# Patient Record
Sex: Female | Born: 1980 | Race: White | Hispanic: No | Marital: Married | Smoking: Current every day smoker
Health system: Southern US, Community
[De-identification: ages and names within clinical notes are randomized; demographics above are authoritative.]

## PROBLEM LIST (undated history)

## (undated) ENCOUNTER — Inpatient Hospital Stay: Payer: Self-pay

## (undated) DIAGNOSIS — H4710 Unspecified papilledema: Secondary | ICD-10-CM

## (undated) DIAGNOSIS — D649 Anemia, unspecified: Secondary | ICD-10-CM

## (undated) DIAGNOSIS — Z5189 Encounter for other specified aftercare: Secondary | ICD-10-CM

## (undated) DIAGNOSIS — R87629 Unspecified abnormal cytological findings in specimens from vagina: Secondary | ICD-10-CM

## (undated) DIAGNOSIS — T7840XA Allergy, unspecified, initial encounter: Secondary | ICD-10-CM

## (undated) DIAGNOSIS — I499 Cardiac arrhythmia, unspecified: Secondary | ICD-10-CM

## (undated) DIAGNOSIS — F419 Anxiety disorder, unspecified: Secondary | ICD-10-CM

## (undated) DIAGNOSIS — J45909 Unspecified asthma, uncomplicated: Secondary | ICD-10-CM

## (undated) DIAGNOSIS — M419 Scoliosis, unspecified: Secondary | ICD-10-CM

## (undated) DIAGNOSIS — K219 Gastro-esophageal reflux disease without esophagitis: Secondary | ICD-10-CM

## (undated) DIAGNOSIS — E876 Hypokalemia: Secondary | ICD-10-CM

## (undated) HISTORY — DX: Encounter for other specified aftercare: Z51.89

## (undated) HISTORY — DX: Unspecified asthma, uncomplicated: J45.909

## (undated) HISTORY — DX: Allergy, unspecified, initial encounter: T78.40XA

---
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
## 2013-09-02 DIAGNOSIS — O3410 Maternal care for benign tumor of corpus uteri, unspecified trimester: Secondary | ICD-10-CM | POA: Insufficient documentation

## 2013-09-23 DIAGNOSIS — O4700 False labor before 37 completed weeks of gestation, unspecified trimester: Secondary | ICD-10-CM | POA: Insufficient documentation

## 2013-10-20 DIAGNOSIS — B3731 Acute candidiasis of vulva and vagina: Secondary | ICD-10-CM | POA: Insufficient documentation

## 2013-10-20 DIAGNOSIS — Z369 Encounter for antenatal screening, unspecified: Secondary | ICD-10-CM | POA: Insufficient documentation

## 2015-08-03 ENCOUNTER — Encounter: Payer: Self-pay | Admitting: Emergency Medicine

## 2015-08-03 ENCOUNTER — Emergency Department
Admission: EM | Admit: 2015-08-03 | Discharge: 2015-08-03 | Disposition: A | Discharge status: Home / Self Care | Payer: Medicaid Other | Attending: Emergency Medicine | Admitting: Emergency Medicine

## 2015-08-03 DIAGNOSIS — Z3A01 Less than 8 weeks gestation of pregnancy: Secondary | ICD-10-CM | POA: Insufficient documentation

## 2015-08-03 DIAGNOSIS — O2311 Infections of bladder in pregnancy, first trimester: Secondary | ICD-10-CM | POA: Diagnosis not present

## 2015-08-03 DIAGNOSIS — Z3491 Encounter for supervision of normal pregnancy, unspecified, first trimester: Secondary | ICD-10-CM

## 2015-08-03 DIAGNOSIS — O9989 Other specified diseases and conditions complicating pregnancy, childbirth and the puerperium: Secondary | ICD-10-CM | POA: Diagnosis present

## 2015-08-03 LAB — COMPREHENSIVE METABOLIC PANEL
ALBUMIN: 4.2 g/dL (ref 3.5–5.0)
ALT: 16 U/L (ref 14–54)
ANION GAP: 6 (ref 5–15)
AST: 18 U/L (ref 15–41)
Alkaline Phosphatase: 49 U/L (ref 38–126)
BILIRUBIN TOTAL: 0.6 mg/dL (ref 0.3–1.2)
BUN: 8 mg/dL (ref 6–20)
CO2: 23 mmol/L (ref 22–32)
Calcium: 9.2 mg/dL (ref 8.9–10.3)
Chloride: 108 mmol/L (ref 101–111)
Creatinine, Ser: 0.69 mg/dL (ref 0.44–1.00)
GFR calc Af Amer: >60 mL/min (ref >60)
GFR calc non Af Amer: >60 mL/min (ref >60)
GLUCOSE: 92 mg/dL (ref 65–99)
POTASSIUM: 3.8 mmol/L (ref 3.5–5.1)
SODIUM: 137 mmol/L (ref 135–145)
Total Protein: 7.1 g/dL (ref 6.5–8.1)

## 2015-08-03 LAB — URINALYSIS COMPLETE WITH MICROSCOPIC (ARMC ONLY)
Bilirubin Urine: NEGATIVE
Glucose, UA: NEGATIVE mg/dL
Hgb urine dipstick: NEGATIVE
KETONES UR: NEGATIVE mg/dL
NITRITE: NEGATIVE
PH: 6 (ref 5.0–8.0)
PROTEIN: NEGATIVE mg/dL
Specific Gravity, Urine: 1.017 (ref 1.005–1.030)

## 2015-08-03 LAB — CBC WITH DIFFERENTIAL/PLATELET
Basophils Absolute: 0.1 10*3/uL (ref 0–0.1)
Basophils Relative: 1 %
Eosinophils Absolute: 0.1 10*3/uL (ref 0–0.7)
Eosinophils Relative: 1 %
HEMATOCRIT: 41.1 % (ref 35.0–47.0)
HEMOGLOBIN: 14 g/dL (ref 12.0–16.0)
LYMPHS ABS: 3.8 10*3/uL — AB (ref 1.0–3.6)
LYMPHS PCT: 33 %
MCH: 30.1 pg (ref 26.0–34.0)
MCHC: 34.1 g/dL (ref 32.0–36.0)
MCV: 88.3 fL (ref 80.0–100.0)
MONO ABS: 0.5 10*3/uL (ref 0.2–0.9)
MONOS PCT: 4 %
NEUTROS ABS: 7 10*3/uL — AB (ref 1.4–6.5)
NEUTROS PCT: 61 %
Platelets: 224 10*3/uL (ref 150–440)
RBC: 4.65 MIL/uL (ref 3.80–5.20)
RDW: 13.1 % (ref 11.5–14.5)
WBC: 11.5 10*3/uL — ABNORMAL HIGH (ref 3.6–11.0)

## 2015-08-03 LAB — PREGNANCY, URINE: PREG TEST UR: POSITIVE — AB

## 2015-08-03 LAB — LIPASE, BLOOD: Lipase: 61 U/L — ABNORMAL HIGH (ref 22–51)

## 2015-08-03 MED ORDER — NITROFURANTOIN MACROCRYSTAL 100 MG PO CAPS: 100.0000 mg | ORAL_CAPSULE | Freq: Two times a day (BID) | ORAL | Status: DC

## 2015-08-03 MED ORDER — ONDANSETRON HCL 4 MG/2ML IJ SOLN
4.0000 mg | Freq: Once | INTRAMUSCULAR | Status: AC
  Administered 2015-08-03: 4 mg via INTRAVENOUS
  Filled 2015-08-03: qty 2

## 2015-08-03 MED ORDER — KETOROLAC TROMETHAMINE 30 MG/ML IJ SOLN
30.0000 mg | Freq: Once | INTRAMUSCULAR | Status: AC
  Administered 2015-08-03: 30 mg via INTRAVENOUS
  Filled 2015-08-03: qty 1

## 2015-08-03 MED ORDER — PRENATAL VITAMINS 0.8 MG PO TABS: 1.0000 | ORAL_TABLET | Freq: Every day | ORAL | Status: DC

## 2015-08-03 NOTE — Discharge Instructions (Signed)
First Trimester of Pregnancy The first trimester of pregnancy is from week 1 until the end of week 12 (months 1 through 3). A week after a sperm fertilizes an egg, the egg will implant on the wall of the uterus. This embryo will begin to develop into a baby. Genes from you and your partner are forming the baby. The female genes determine whether the baby is a boy or a girl. At 6-8 weeks, the eyes and face are formed, and the heartbeat can be seen on ultrasound. At the end of 12 weeks, all the baby's organs are formed.  Now that you are pregnant, you will want to do everything you can to have a healthy baby. Two of the most important things are to get good prenatal care and to follow your health care provider's instructions. Prenatal care is all the medical care you receive before the baby's birth. This care will help prevent, find, and treat any problems during the pregnancy and childbirth. BODY CHANGES Your body goes through many changes during pregnancy. The changes vary from woman to woman.   You may gain or lose a couple of pounds at first.  You may feel sick to your stomach (nauseous) and throw up (vomit). If the vomiting is uncontrollable, call your health care provider.  You may tire easily.  You may develop headaches that can be relieved by medicines approved by your health care provider.  You may urinate more often. Painful urination may mean you have a bladder infection.  You may develop heartburn as a result of your pregnancy.  You may develop constipation because certain hormones are causing the muscles that push waste through your intestines to slow down.  You may develop hemorrhoids or swollen, bulging veins (varicose veins).  Your breasts may begin to grow larger and become tender. Your nipples may stick out more, and the tissue that surrounds them (areola) may become darker.  Your gums may bleed and may be sensitive to brushing and flossing.  Dark spots or blotches (chloasma,  mask of pregnancy) may develop on your face. This will likely fade after the baby is born.  Your menstrual periods will stop.  You may have a loss of appetite.  You may develop cravings for certain kinds of food.  You may have changes in your emotions from day to day, such as being excited to be pregnant or being concerned that something may go wrong with the pregnancy and baby.  You may have more vivid and strange dreams.  You may have changes in your hair. These can include thickening of your hair, rapid growth, and changes in texture. Some women also have hair loss during or after pregnancy, or hair that feels dry or thin. Your hair will most likely return to normal after your baby is born. WHAT TO EXPECT AT YOUR PRENATAL VISITS During a routine prenatal visit:  You will be weighed to make sure you and the baby are growing normally.  Your blood pressure will be taken.  Your abdomen will be measured to track your baby's growth.  The fetal heartbeat will be listened to starting around week 10 or 12 of your pregnancy.  Test results from any previous visits will be discussed. Your health care provider may ask you:  How you are feeling.  If you are feeling the baby move.  If you have had any abnormal symptoms, such as leaking fluid, bleeding, severe headaches, or abdominal cramping.  If you have any questions. Other tests  that may be performed during your first trimester include:  Blood tests to find your blood type and to check for the presence of any previous infections. They will also be used to check for low iron levels (anemia) and Rh antibodies. Later in the pregnancy, blood tests for diabetes will be done along with other tests if problems develop.  Urine tests to check for infections, diabetes, or protein in the urine.  An ultrasound to confirm the proper growth and development of the baby.  An amniocentesis to check for possible genetic problems.  Fetal screens for  spina bifida and Down syndrome.  You may need other tests to make sure you and the baby are doing well. HOME CARE INSTRUCTIONS  Medicines  Follow your health care provider's instructions regarding medicine use. Specific medicines may be either safe or unsafe to take during pregnancy.  Take your prenatal vitamins as directed.  If you develop constipation, try taking a stool softener if your health care provider approves. Diet  Eat regular, well-balanced meals. Choose a variety of foods, such as meat or vegetable-based protein, fish, milk and low-fat dairy products, vegetables, fruits, and whole grain breads and cereals. Your health care provider will help you determine the amount of weight gain that is right for you.  Avoid raw meat and uncooked cheese. These carry germs that can cause birth defects in the baby.  Eating four or five small meals rather than three large meals a day may help relieve nausea and vomiting. If you start to feel nauseous, eating a few soda crackers can be helpful. Drinking liquids between meals instead of during meals also seems to help nausea and vomiting.  If you develop constipation, eat more high-fiber foods, such as fresh vegetables or fruit and whole grains. Drink enough fluids to keep your urine clear or pale yellow. Activity and Exercise  Exercise only as directed by your health care provider. Exercising will help you:  Control your weight.  Stay in shape.  Be prepared for labor and delivery.  Experiencing pain or cramping in the lower abdomen or low back is a good sign that you should stop exercising. Check with your health care provider before continuing normal exercises.  Try to avoid standing for long periods of time. Move your legs often if you must stand in one place for a long time.  Avoid heavy lifting.  Wear low-heeled shoes, and practice good posture.  You may continue to have sex unless your health care provider directs you  otherwise. Relief of Pain or Discomfort  Wear a good support bra for breast tenderness.   Take warm sitz baths to soothe any pain or discomfort caused by hemorrhoids. Use hemorrhoid cream if your health care provider approves.   Rest with your legs elevated if you have leg cramps or low back pain.  If you develop varicose veins in your legs, wear support hose. Elevate your feet for 15 minutes, 3-4 times a day. Limit salt in your diet. Prenatal Care  Schedule your prenatal visits by the twelfth week of pregnancy. They are usually scheduled monthly at first, then more often in the last 2 months before delivery.  Write down your questions. Take them to your prenatal visits.  Keep all your prenatal visits as directed by your health care provider. Safety  Wear your seat belt at all times when driving.  Make a list of emergency phone numbers, including numbers for family, friends, the hospital, and police and fire departments. General Tips  Ask your health care provider for a referral to a local prenatal education class. Begin classes no later than at the beginning of month 6 of your pregnancy.  Ask for help if you have counseling or nutritional needs during pregnancy. Your health care provider can offer advice or refer you to specialists for help with various needs.  Do not use hot tubs, steam rooms, or saunas.  Do not douche or use tampons or scented sanitary pads.  Do not cross your legs for long periods of time.  Avoid cat litter boxes and soil used by cats. These carry germs that can cause birth defects in the baby and possibly loss of the fetus by miscarriage or stillbirth.  Avoid all smoking, herbs, alcohol, and medicines not prescribed by your health care provider. Chemicals in these affect the formation and growth of the baby.  Schedule a dentist appointment. At home, brush your teeth with a soft toothbrush and be gentle when you floss. SEEK MEDICAL CARE IF:   You have  dizziness.  You have mild pelvic cramps, pelvic pressure, or nagging pain in the abdominal area.  You have persistent nausea, vomiting, or diarrhea.  You have a bad smelling vaginal discharge.  You have pain with urination.  You notice increased swelling in your face, hands, legs, or ankles. SEEK IMMEDIATE MEDICAL CARE IF:   You have a fever.  You are leaking fluid from your vagina.  You have spotting or bleeding from your vagina.  You have severe abdominal cramping or pain.  You have rapid weight gain or loss.  You vomit blood or material that looks like coffee grounds.  You are exposed to [place] measles and have never had them.  You are exposed to fifth disease or chickenpox.  You develop a severe headache.  You have shortness of breath.  You have any kind of trauma, such as from a fall or a car accident. Document Released: 11/14/2001 Document Revised: 04/06/2014 Document Reviewed: 09/30/2013 [facility] Patient Information 2015 [place], [place]. This information is not intended to replace advice given to you by your health care provider. Make sure you discuss any questions you have with your health care provider.  Pregnancy and Urinary Tract Infection A urinary tract infection (UTI) is a bacterial infection of the urinary tract. Infection of the urinary tract can include the ureters, kidneys (pyelonephritis), bladder (cystitis), and urethra (urethritis). All pregnant women should be screened for bacteria in the urinary tract. Identifying and treating a UTI will decrease the risk of preterm labor and developing more serious infections in both the mother and baby. CAUSES Bacteria germs cause almost all UTIs.  RISK FACTORS Many factors can increase your chances of getting a UTI during pregnancy. These include:  Having a short urethra.  Poor toilet and hygiene habits.  Sexual intercourse.  Blockage of urine along the urinary tract.  Problems with the pelvic muscles or  nerves.  Diabetes.  Obesity.  Bladder problems after having several children.  Previous history of UTI. SIGNS AND SYMPTOMS   Pain, burning, or a stinging feeling when urinating.  Suddenly feeling the need to urinate right away (urgency).  Loss of bladder control (urinary incontinence).  Frequent urination, more than is common with pregnancy.  Lower abdominal or back discomfort.  Cloudy urine.  Blood in the urine (hematuria).  Fever. When the kidneys are infected, the symptoms may be:  Back pain.  Flank pain on the right side more so than the left.  Fever.  Chills.  Nausea.  Vomiting. DIAGNOSIS  A urinary tract infection is usually diagnosed through urine tests. Additional tests and procedures are sometimes done. These may include:  Ultrasound exam of the kidneys, ureters, bladder, and urethra.  Looking in the bladder with a lighted tube (cystoscopy). TREATMENT Typically, UTIs can be treated with antibiotic medicines.  HOME CARE INSTRUCTIONS   Only take over-the-counter or prescription medicines as directed by your health care provider. If you were prescribed antibiotics, take them as directed. Finish them even if you start to feel better.  Drink enough fluids to keep your urine clear or pale yellow.  Do not have sexual intercourse until the infection is gone and your health care provider says it is okay.  Make sure you are tested for UTIs throughout your pregnancy. These infections often come back. Preventing a UTI in the Future  Practice good toilet habits. Always wipe from front to back. Use the tissue only once.  Do not hold your urine. Empty your bladder as soon as possible when the urge comes.  Do not douche or use deodorant sprays.  Wash with soap and warm water around the genital area and the anus.  Empty your bladder before and after sexual intercourse.  Wear underwear with a cotton crotch.  Avoid caffeine and carbonated drinks. They can  irritate the bladder.  Drink cranberry juice or take cranberry pills. This may decrease the risk of getting a UTI.  Do not drink alcohol.  Keep all your appointments and tests as scheduled. SEEK MEDICAL CARE IF:   Your symptoms get worse.  You are still having fevers 2 or more days after treatment begins.  You have a rash.  You feel that you are having problems with medicines prescribed.  You have abnormal vaginal discharge. SEEK IMMEDIATE MEDICAL CARE IF:   You have back or flank pain.  You have chills.  You have blood in your urine.  You have nausea and vomiting.  You have contractions of your uterus.  You have a gush of fluid from the vagina. MAKE SURE YOU:  Understand these instructions.   Will watch your condition.   Will get help right away if you are not doing well or get worse.  Document Released: 03/17/2011 Document Revised: 09/10/2013 Document Reviewed: 06/19/2013 [facility] Patient Information 2015 [place], [place]. This information is not intended to replace advice given to you by your health care provider. Make sure you discuss any questions you have with your health care provider.

## 2015-08-03 NOTE — ED Provider Notes (Signed)
[facility] Emergency Department Provider Note  ____________________________________________  Time seen: 9:45 AM  I have reviewed the triage vital signs and the nursing notes.   HISTORY  Chief Complaint Abdominal Pain    HPI Chantalle Defilippo is a 34 y.o. female who complains of right lower quadrant abdominal pain for the past 2 days. It is stabbing in nature, constant, waxing and waning. Radiates to the lower back. Better if she doubles over, worse with upright and walking movement. No nausea vomiting or diarrhea. No fevers or chills. No dysuria frequency urgency. No vaginal bleeding or discharge. No trauma.Pain is sharp and stabbing and moderate intensity.     History reviewed. No pertinent past medical history. History of ovarian cyst the resolve spontaneously  There are no active problems to display for this patient.    History reviewed. No pertinent past surgical history. negative  Current Outpatient Rx  Name  Route  Sig  Dispense  Refill  . nitrofurantoin (MACRODANTIN) 100 MG capsule   Oral   Take 1 capsule (100 mg total) by mouth 2 (two) times daily.   6 capsule   0   . Prenatal Multivit-Min-Fe-FA (PRENATAL VITAMINS) 0.8 MG tablet   Oral   Take 1 tablet by mouth daily.   90 tablet   3    negative  Allergies Review of patient's allergies indicates no known allergies.   History reviewed. No pertinent family history.  Social History Social History  Substance Use Topics  . Smoking status: Never Smoker   . Smokeless tobacco: None  . Alcohol Use: No    Review of Systems  Constitutional:   No fever or chills. No weight changes Eyes:   No blurry vision or double vision.  ENT:   No sore throat. Cardiovascular:   No chest pain. Respiratory:   No dyspnea or cough. Gastrointestinal:   Abdominal pain as above.  No BRBPR or melena. Genitourinary:   Negative for dysuria, urinary retention, bloody urine, or difficulty  urinating. Musculoskeletal:   Negative for back pain. No joint swelling or pain. Skin:   Negative for rash. Neurological:   Negative for headaches, focal weakness or numbness. Psychiatric:  No anxiety or depression.   Endocrine:  No hot/cold intolerance, changes in energy, or sleep difficulty.  10-point ROS otherwise negative.  ____________________________________________   PHYSICAL EXAM:  VITAL SIGNS: ED Triage Vi  Enc Vitals Group     BP 08/03/15 0947 120/79 mmHg     Pulse Rate 08/03/15 0947 70     Resp 08/03/15 0947 16     Temp 08/03/15 0947 97.6 F (36.4 C)     Temp Source 08/03/15 0947 Oral     SpO2 08/03/15 0947 98 %     Weight 08/03/15 0947 184 lb (83.462 kg)     Height 08/03/15 0947 5\' 3"  (1.6 m)     Head Cir --      Peak Flow --      Pain Score 08/03/15 0940 5     Pain Loc --      Pain Edu? --      Excl. in [place]? --      Constitutional:   Alert and oriented. Well appearing and in no distress. Eyes:   No scleral icterus. No conjunctival pallor. PERRL. EOMI ENT   Head:   Normocephalic and atraumatic.   Nose:   No congestion/rhinnorhea. No septal hematoma   Mouth/Throat:   MMM, no pharyngeal erythema. No peritonsillar mass. No uvula  shift.   Neck:   No stridor. No SubQ emphysema. No meningismus. Hematological/Lymphatic/Immunilogical:   No cervical lymphadenopathy. Cardiovascular:   RRR. Normal and symmetric distal pulses are present in all extremities. No murmurs, rubs, or gallops. Respiratory:   Normal respiratory effort without tachypnea nor retractions. Breath sounds are clear and equal bilaterally. No wheezes/rales/rhonchi. Gastrointestinal:   Suprapubic and right lower quadrant tenderness. No distention. There is no CVA tenderness.  No rebound, rigidity, or guarding. Genitourinary:   deferred Musculoskeletal:   Nontender with normal range of motion in all extremities. No joint effusions.  No lower extremity tenderness.  No edema. Neurologic:    Normal speech and language.  CN 2-10 normal. Motor grossly intact. No pronator drift.  Normal gait. No gross focal neurologic deficits are appreciated.  Skin:    Skin is warm, dry and intact. No rash noted.  No petechiae, purpura, or bullae. Psychiatric:   Mood and affect are normal. Speech and behavior are normal. Patient exhibits appropriate insight and judgment.  ____________________________________________    LABS (pertinent positives/negatives) (all labs ordered are listed, but only abnormal results are displayed) Labs Reviewed  LIPASE, BLOOD - Abnormal; Notable for the following:    Lipase 61 (*)    All other components within normal limits  CBC WITH DIFFERENTIAL/PLATELET - Abnormal; Notable for the following:    WBC 11.5 (*)    Neutro Abs 7.0 (*)    Lymphs Abs 3.8 (*)    All other components within normal limits  URINALYSIS COMPLETEWITH MICROSCOPIC ([facility] ONLY) - Abnormal; Notable for the following:    Color, Urine YELLOW (*)    APPearance CLOUDY (*)    Leukocytes, UA 3+ (*)    Bacteria, UA RARE (*)    Squamous Epithelial / LPF 6-30 (*)    All other components within normal limits  PREGNANCY, URINE - Abnormal; Notable for the following:    Preg Test, Ur POSITIVE (*)    All other components within normal limits  URINE CULTURE  COMPREHENSIVE METABOLIC PANEL   ____________________________________________   EKG    ____________________________________________    RADIOLOGY    ____________________________________________   PROCEDURES   ____________________________________________   INITIAL IMPRESSION / ASSESSMENT AND PLAN / ED COURSE  Pertinent labs & imaging results that were available during my care of the patient were reviewed by me and considered in my medical decision making (see chart for details).  Labs reveal urinary tract infection as well as positive pregnancy test. Patient reports a negative pregnancy test 2 weeks ago. Denies any discharge  or vaginal bleeding. She is early in pregnancy likely 4-[redacted] weeks pregnant. Her periods are irregular and she is unsure when her LMP was. We'll start her on prenatal vitamins and have her follow-up with gynecology. Also start her on Macrobid for the urinary tract infection, and sent a culture. No significant evidence of pyelonephritis sepsis torsion or appendicitis at this time.     ____________________________________________   FINAL CLINICAL IMPRESSION(S) / ED DIAGNOSES  Final diagnoses:  First trimester pregnancy  Cystitis during pregnancy in first trimester, antepartum      Carrie Mew, MD 08/03/15 1202

## 2015-08-03 NOTE — ED Notes (Signed)
Patient to ED with c/o RLQ pain, stabbing in nature.

## 2015-08-05 LAB — URINE CULTURE: CULTURE: NO GROWTH

## 2015-08-12 ENCOUNTER — Emergency Department: Payer: Medicaid Other

## 2015-08-12 ENCOUNTER — Emergency Department
Admission: EM | Admit: 2015-08-12 | Discharge: 2015-08-12 | Disposition: A | Discharge status: Home / Self Care | Payer: Medicaid Other | Attending: Student | Admitting: Student

## 2015-08-12 ENCOUNTER — Encounter: Payer: Self-pay | Admitting: Emergency Medicine

## 2015-08-12 DIAGNOSIS — B373 Candidiasis of vulva and vagina: Secondary | ICD-10-CM | POA: Diagnosis not present

## 2015-08-12 DIAGNOSIS — Z3A01 Less than 8 weeks gestation of pregnancy: Secondary | ICD-10-CM | POA: Diagnosis not present

## 2015-08-12 DIAGNOSIS — O200 Threatened abortion: Secondary | ICD-10-CM | POA: Diagnosis not present

## 2015-08-12 DIAGNOSIS — Z792 Long term (current) use of antibiotics: Secondary | ICD-10-CM | POA: Diagnosis not present

## 2015-08-12 DIAGNOSIS — O209 Hemorrhage in early pregnancy, unspecified: Secondary | ICD-10-CM | POA: Diagnosis present

## 2015-08-12 DIAGNOSIS — O98811 Other maternal infectious and parasitic diseases complicating pregnancy, first trimester: Secondary | ICD-10-CM | POA: Insufficient documentation

## 2015-08-12 DIAGNOSIS — R102 Pelvic and perineal pain: Secondary | ICD-10-CM

## 2015-08-12 DIAGNOSIS — N920 Excessive and frequent menstruation with regular cycle: Secondary | ICD-10-CM

## 2015-08-12 DIAGNOSIS — Z79899 Other long term (current) drug therapy: Secondary | ICD-10-CM | POA: Insufficient documentation

## 2015-08-12 DIAGNOSIS — B3731 Acute candidiasis of vulva and vagina: Secondary | ICD-10-CM

## 2015-08-12 LAB — CHLAMYDIA/NGC RT PCR (ARMC ONLY)
CHLAMYDIA TR: NOT DETECTED
N GONORRHOEAE: NOT DETECTED

## 2015-08-12 LAB — URINALYSIS COMPLETE WITH MICROSCOPIC (ARMC ONLY)
BILIRUBIN URINE: NEGATIVE
Glucose, UA: NEGATIVE mg/dL
KETONES UR: NEGATIVE mg/dL
Nitrite: NEGATIVE
PH: 5 (ref 5.0–8.0)
Protein, ur: NEGATIVE mg/dL
Specific Gravity, Urine: 1.01 (ref 1.005–1.030)

## 2015-08-12 LAB — CBC WITH DIFFERENTIAL/PLATELET
BASOS ABS: 0.1 10*3/uL (ref 0–0.1)
Basophils Relative: 1 %
EOS PCT: 1 %
Eosinophils Absolute: 0.1 10*3/uL (ref 0–0.7)
HEMATOCRIT: 40.4 % (ref 35.0–47.0)
Hemoglobin: 14 g/dL (ref 12.0–16.0)
LYMPHS ABS: 3.1 10*3/uL (ref 1.0–3.6)
LYMPHS PCT: 25 %
MCH: 30.8 pg (ref 26.0–34.0)
MCHC: 34.6 g/dL (ref 32.0–36.0)
MCV: 89.1 fL (ref 80.0–100.0)
MONO ABS: 0.5 10*3/uL (ref 0.2–0.9)
MONOS PCT: 4 %
NEUTROS ABS: 8.5 10*3/uL — AB (ref 1.4–6.5)
Neutrophils Relative %: 69 %
Platelets: 190 10*3/uL (ref 150–440)
RBC: 4.54 MIL/uL (ref 3.80–5.20)
RDW: 13.2 % (ref 11.5–14.5)
WBC: 12.4 10*3/uL — ABNORMAL HIGH (ref 3.6–11.0)

## 2015-08-12 LAB — ANTIBODY SCREEN: Antibody Screen: NEGATIVE

## 2015-08-12 LAB — ABO/RH: ABO/RH(D): A NEG

## 2015-08-12 LAB — COMPREHENSIVE METABOLIC PANEL
ALT: 18 U/L (ref 14–54)
AST: 20 U/L (ref 15–41)
Albumin: 4 g/dL (ref 3.5–5.0)
Alkaline Phosphatase: 46 U/L (ref 38–126)
Anion gap: 7 (ref 5–15)
BILIRUBIN TOTAL: 0.5 mg/dL (ref 0.3–1.2)
BUN: 8 mg/dL (ref 6–20)
CO2: 23 mmol/L (ref 22–32)
Calcium: 9.4 mg/dL (ref 8.9–10.3)
Chloride: 109 mmol/L (ref 101–111)
Creatinine, Ser: 0.6 mg/dL (ref 0.44–1.00)
Glucose, Bld: 91 mg/dL (ref 65–99)
POTASSIUM: 3.8 mmol/L (ref 3.5–5.1)
Sodium: 139 mmol/L (ref 135–145)
TOTAL PROTEIN: 7 g/dL (ref 6.5–8.1)

## 2015-08-12 LAB — WET PREP, GENITAL
CLUE CELLS WET PREP: NONE SEEN
Trich, Wet Prep: NONE SEEN

## 2015-08-12 LAB — HCG, QUANTITATIVE, PREGNANCY: hCG, Beta Chain, Quant, S: 87321 m[IU]/mL — ABNORMAL HIGH (ref <5)

## 2015-08-12 MED ORDER — CLOTRIMAZOLE 1 % VA CREA: 1.0000 | TOPICAL_CREAM | Freq: Every day | VAGINAL | Status: DC

## 2015-08-12 MED ORDER — RHO D IMMUNE GLOBULIN 1500 UNIT/2ML IJ SOSY
300.0000 ug | PREFILLED_SYRINGE | Freq: Once | INTRAMUSCULAR | Status: AC
  Administered 2015-08-12: 300 ug via INTRAMUSCULAR
  Filled 2015-08-12: qty 2

## 2015-08-12 NOTE — ED Notes (Signed)
Patient reports approximately 14-[redacted] weeks pregnant, woke this morning with some spotting and cramping.

## 2015-08-12 NOTE — ED Notes (Signed)
By dates, patient 16 weeks.

## 2015-08-12 NOTE — ED Notes (Signed)
Pt complains of spotting that started this AM and complains of aching to lower abdomen. Pt states she is pregnant but not sure how many weeks she is.

## 2015-08-12 NOTE — ED Provider Notes (Signed)
[facility] Emergency Department Provider Note  ____________________________________________  Time seen: Approximately 12:08 PM  I have reviewed the triage vital signs and the nursing notes.   HISTORY  Chief Complaint Vaginal Bleeding and Abdominal Cramping    HPI Allison Flynn is a 34 y.o. female G6 P4 at approximately 15 weeks estimated gestational age by last menstrual period, no chronic medical problems, who presents with light  vaginal spotting this morning as well as diffuse lower abdominal aching pain. She has had mild nausea and vomiting throughout the entirety of this pregnancy and this is unchanged from prior. No fevers, no diarrhea, no chest pain or difficulty breathing. Current severity of symptoms is mild. No modifying factors.   History reviewed. No pertinent past medical history.  There are no active problems to display for this patient.   History reviewed. No pertinent past surgical history.  Current Outpatient Rx  Name  Route  Sig  Dispense  Refill  . clotrimazole (GYNE-LOTRIMIN) 1 % vaginal cream   Vaginal   Place 1 Applicatorful vaginally at bedtime.   45 g   0     Dispense quantity sufficient for 7 days   . nitrofurantoin (MACRODANTIN) 100 MG capsule   Oral   Take 1 capsule (100 mg total) by mouth 2 (two) times daily.   6 capsule   0   . Prenatal Multivit-Min-Fe-FA (PRENATAL VITAMINS) 0.8 MG tablet   Oral   Take 1 tablet by mouth daily.   90 tablet   3     Allergies Review of patient's allergies indicates no known allergies.  History reviewed. No pertinent family history.  Social History Social History  Substance Use Topics  . Smoking status: Never Smoker   . Smokeless tobacco: None  . Alcohol Use: No    Review of Systems Constitutional: No fever/chills Eyes: No visual changes. ENT: No sore throat. Cardiovascular: Denies chest pain. Respiratory: Denies shortness of breath. Gastrointestinal:  +abdominal pain.  + nausea, + vomiting.  No diarrhea.  No constipation. Genitourinary: Negative for dysuria. Musculoskeletal: Negative for back pain. Skin: Negative for rash. Neurological: Negative for headaches, focal weakness or numbness.  10-point ROS otherwise negative.  ____________________________________________   PHYSICAL EXAM:  VITAL SIGNS: ED Triage Vitals  Enc Vitals Group     BP 08/12/15 0744 120/72 mmHg     Pulse Rate 08/12/15 0744 66     Resp 08/12/15 0744 18     Temp 08/12/15 0744 98.4 F (36.9 C)     Temp Source 08/12/15 0744 Oral     SpO2 08/12/15 0744 100 %     Weight 08/12/15 0744 184 lb (83.462 kg)     Height 08/12/15 0744 5\' 3"  (1.6 m)     Head Cir --      Peak Flow --      Pain Score 08/12/15 0744 3     Pain Loc --      Pain Edu? --      Excl. in [place]? --     Constitutional: Alert and oriented. Well appearing and in no acute distress. Eyes: Conjunctivae are normal. PERRL. EOMI. Head: Atraumatic. Nose: No congestion/rhinnorhea. Mouth/Throat: Mucous membranes are moist.  Oropharynx non-erythematous. Neck: No stridor.   Cardiovascular: Normal rate, regular rhythm. Grossly normal heart sounds.  Good peripheral circulation. Respiratory: Normal respiratory effort.  No retractions. Lungs CTAB. Gastrointestinal: Soft with faint lower abdominal tenderness, no rebound, no guarding. No distention. No abdominal bruits. No CVA tenderness. Pelvic: Copious thick  white vaginal discharge from a closed off, no CMT or BMT, the cervix appears friable. Musculoskeletal: No lower extremity tenderness nor edema.  No joint effusions. Neurologic:  Normal speech and language. No gross focal neurologic deficits are appreciated. No gait instability. Skin:  Skin is warm, dry and intact. No rash noted. Psychiatric: Mood and affect are normal. Speech and behavior are normal.  ____________________________________________   LABS (all labs ordered are listed, but only abnormal  results are displayed)  Labs Reviewed  WET PREP, GENITAL - Abnormal; Notable for the following:    Yeast Wet Prep HPF POC FEW (*)    WBC, Wet Prep HPF POC MANY (*)    All other components within normal limits  HCG, QUANTITATIVE, PREGNANCY - Abnormal; Notable for the following:    hCG, Beta Chain, Quant, S 87321 (*)    All other components within normal limits  CBC WITH DIFFERENTIAL/PLATELET - Abnormal; Notable for the following:    WBC 12.4 (*)    Neutro Abs 8.5 (*)    All other components within normal limits  URINALYSIS COMPLETEWITH MICROSCOPIC ([facility] ONLY) - Abnormal; Notable for the following:    Color, Urine YELLOW (*)    APPearance HAZY (*)    Hgb urine dipstick 1+ (*)    Leukocytes, UA 3+ (*)    Bacteria, UA RARE (*)    Squamous Epithelial / LPF 6-30 (*)    All other components within normal limits  CHLAMYDIA/NGC RT PCR ([facility] ONLY)  COMPREHENSIVE METABOLIC PANEL  ABO/RH  ANTIBODY SCREEN  RHOGAM INJECTION   ____________________________________________  EKG  none ____________________________________________  RADIOLOGY  US OB  IMPRESSION: Single viable intrauterine pregnancy at 7 weeks 0 day. 2.1 cm right ovarian corpus luteal cyst. ____________________________________________   PROCEDURES  Procedure(s) performed: None  Critical Care performed: No  ____________________________________________   INITIAL IMPRESSION / ASSESSMENT AND PLAN / ED COURSE  Pertinent labs & imaging results that were available during my care of the patient were reviewed by me and considered in my medical decision making (see chart for details).  Allison Flynn is a 34 y.o. female G6 P4 at approximately 15 weeks estimated gestational age by last menstrual period, no chronic medical problems, who presents with light  vaginal spotting this morning as well as diffuse lower abdominal aching pain. On exam, she is very well-appearing and in no acute distress. Vital  signs stable, she is afebrile. She has the faintest suprapubic tenderness to palpation, no rebound, no guarding. Os is closed, no active bleeding though there is large amount of thick white discharge. No bimanual tenderness or cervical motion tenderness. Ultrasound confirms single live intrauterine pregnancy at [redacted] weeks gestational age. There is also a 2.1 cm right ovarian corpus luteal cyst. We'll treat for yeast infection with topical clotrimazole given the findings on wet prep CBC with mild leukocytosis which is nonspecific. CMP unremarkable. Blood type is A- we'll give rogram. Urinalysis appears similar to UA on 08/03/2015 which was initially concerning for urinary tract infection, treated with Macrobid however cultures never grew any bacteria. Suspect contaminationand she has no urinary symptoms or for will send culture. Discussed threatened miscarriage, return precautions, need for close follow-up with OB GYN. She is comfortable with the discharge plan. ____________________________________________   FINAL CLINICAL IMPRESSION(S) / ED DIAGNOSES  Final diagnoses:  Yeast infection of the vagina  Threatened abortion in first trimester      Joanne Gavel, MD 08/12/15 [id number]

## 2015-08-13 LAB — RHOGAM INJECTION: Unit division: 0

## 2015-08-18 ENCOUNTER — Other Ambulatory Visit: Payer: Self-pay | Admitting: Advanced Practice Midwife

## 2015-08-18 DIAGNOSIS — IMO0002 Reserved for concepts with insufficient information to code with codable children: Secondary | ICD-10-CM

## 2015-08-18 DIAGNOSIS — M419 Scoliosis, unspecified: Secondary | ICD-10-CM

## 2015-08-18 DIAGNOSIS — D259 Leiomyoma of uterus, unspecified: Secondary | ICD-10-CM

## 2015-08-19 LAB — OB RESULTS CONSOLE ABO/RH: RH TYPE: NEGATIVE

## 2015-08-19 LAB — OB RESULTS CONSOLE GC/CHLAMYDIA
CHLAMYDIA, DNA PROBE: NEGATIVE
Gonorrhea: NEGATIVE

## 2015-08-19 LAB — OB RESULTS CONSOLE VARICELLA ZOSTER ANTIBODY, IGG: VARICELLA IGG: IMMUNE

## 2015-08-19 LAB — OB RESULTS CONSOLE RPR: RPR: NONREACTIVE

## 2015-08-19 LAB — OB RESULTS CONSOLE ANTIBODY SCREEN: ANTIBODY SCREEN: NEGATIVE

## 2015-08-19 LAB — OB RESULTS CONSOLE RUBELLA ANTIBODY, IGM: Rubella: IMMUNE

## 2015-09-20 ENCOUNTER — Ambulatory Visit (HOSPITAL_BASED_OUTPATIENT_CLINIC_OR_DEPARTMENT_OTHER)
Admission: RE | Admit: 2015-09-20 | Discharge: 2015-09-20 | Disposition: A | Discharge status: Home / Self Care | Payer: Medicaid Other | Source: Ambulatory Visit | Attending: Maternal & Fetal Medicine | Admitting: Maternal & Fetal Medicine

## 2015-09-20 ENCOUNTER — Ambulatory Visit
Admission: RE | Admit: 2015-09-20 | Discharge: 2015-09-20 | Disposition: A | Discharge status: Home / Self Care | Payer: Medicaid Other | Source: Ambulatory Visit | Attending: Advanced Practice Midwife | Admitting: Advanced Practice Midwife

## 2015-09-20 VITALS — BP 121/68 | HR 101 | Temp 97.9°F | Wt 183.0 lb

## 2015-09-20 DIAGNOSIS — D259 Leiomyoma of uterus, unspecified: Secondary | ICD-10-CM

## 2015-09-20 DIAGNOSIS — Z36 Encounter for antenatal screening of mother: Secondary | ICD-10-CM | POA: Diagnosis not present

## 2015-09-20 DIAGNOSIS — Z369 Encounter for antenatal screening, unspecified: Secondary | ICD-10-CM

## 2015-09-20 DIAGNOSIS — O09291 Supervision of pregnancy with other poor reproductive or obstetric history, first trimester: Secondary | ICD-10-CM

## 2015-09-20 DIAGNOSIS — O99210 Obesity complicating pregnancy, unspecified trimester: Secondary | ICD-10-CM | POA: Insufficient documentation

## 2015-09-20 DIAGNOSIS — Z3A12 12 weeks gestation of pregnancy: Secondary | ICD-10-CM | POA: Insufficient documentation

## 2015-09-20 DIAGNOSIS — M419 Scoliosis, unspecified: Secondary | ICD-10-CM

## 2015-09-20 DIAGNOSIS — M4120 Other idiopathic scoliosis, site unspecified: Secondary | ICD-10-CM | POA: Diagnosis not present

## 2015-09-20 DIAGNOSIS — E669 Obesity, unspecified: Secondary | ICD-10-CM

## 2015-09-20 DIAGNOSIS — O09299 Supervision of pregnancy with other poor reproductive or obstetric history, unspecified trimester: Secondary | ICD-10-CM | POA: Insufficient documentation

## 2015-09-20 DIAGNOSIS — IMO0002 Reserved for concepts with insufficient information to code with codable children: Secondary | ICD-10-CM

## 2015-09-20 HISTORY — DX: Scoliosis, unspecified: M41.9

## 2015-09-20 NOTE — Addendum Note (Signed)
Encounter addended by: Dellia Nims, MD on: 09/20/2015  3:47 PM<BR>     Documentation filed: Notes Section

## 2015-09-20 NOTE — Progress Notes (Addendum)
Referring physician:  [facility] Department Length of Consultation: 40 minutes   Allison Flynn  was referred to [facility] for genetic counseling to review prenatal screening and testing options.  This note summarizes the information we discussed.    First trimester screening, which can include nuchal translucency ultrasound screen and/or first trimester maternal serum marker screening.  The nuchal translucency has approximately an 80% detection rate for Down syndrome and can be positive for other chromosome abnormalities as well as congenital heart defects.  When combined with a maternal serum marker screening, the detection rate is up to 90% for Down syndrome and up to 97% for trisomy 18.     Maternal serum marker screening, a blood test that measures pregnancy proteins, can provide risk assessments for Down syndrome, trisomy 18, and open neural tube defects (spina bifida, anencephaly). Because it does not directly examine the fetus, it cannot positively diagnose or rule out these problems.  Targeted ultrasound uses high frequency sound waves to create an image of the developing fetus.  An ultrasound is often recommended as a routine means of evaluating the pregnancy.  It is also used to screen for fetal anatomy problems (for example, a heart defect) that might be suggestive of a chromosomal or other abnormality.   Should these screening tests indicate an increased concern, then the following diagnostic options would be offered:  The chorionic villus sampling procedure is available for first trimester chromosome analysis.  This involves the withdrawal of a small amount of chorionic villi (tissue from the developing placenta).  Risk of pregnancy loss is estimated to be approximately 1 in 200 to 1 in 100 (0.5 to 1%).  There is approximately a 1% (1 in 100) chance that the CVS chromosome results will be unclear.  Chorionic villi cannot be tested for neural tube  defects.     Amniocentesis involves the removal of a small amount of amniotic fluid from the sac surrounding the fetus with the use of a thin needle inserted through the maternal abdomen and uterus.  Ultrasound guidance is used throughout the procedure.  Fetal cells from amniotic fluid are directly evaluated and > 99.5% of chromosome problems and > 98% of open neural tube defects can be detected. This procedure is generally performed after the 15th week of pregnancy.  The main risks to this procedure include complications leading to miscarriage in less than 1 in 200 cases (0.5%).  As another option for information if the pregnancy is suspected to be an an increased chance for certain chromosome conditions, we also reviewed the availability of cell free fetal DNA testing from maternal blood to determine whether or not the baby may have either Down syndrome, trisomy 74, or trisomy 58.  This test utilizes a maternal blood sample and DNA sequencing technology to isolate circulating cell free fetal DNA from maternal plasma.  The fetal DNA can then be analyzed for DNA sequences that are derived from the three most common chromosomes involved in aneuploidy, chromosomes 13, 18, and 21.  If the overall amount of DNA is greater than the expected level for any of these chromosomes, aneuploidy is suspected.  While we do not consider it a replacement for invasive testing and karyotype analysis, a negative result from this testing would be reassuring, though not a guarantee of a normal chromosome complement for the baby.  An abnormal result is certainly suggestive of an abnormal chromosome complement, though we would still recommend CVS or amniocentesis to confirm any  findings from this testing.   Cystic Fibrosis screening was also discussed with the patient. Cystic fibrosis (CF) is one of the most common genetic conditions in persons of Caucasian ancestry.  This condition occurs in approximately 1 in 2,500 Caucasian  persons and results in thickened secretions in the lungs, digestive, and reproductive systems.  For a baby to be at risk for having CF, both of the parents must be carriers for this condition.  Approximately 1 in 55 Caucasian persons is a carrier for CF.  Current carrier testing looks for the most common mutations in the gene for CF and can detect approximately 90% of carriers in the Caucasian population.  This means that the carrier screening can greatly reduce, but cannot eliminate, the chance for an individual to have a child with CF.  If an individual is found to be a carrier for CF, then carrier testing would be available for the partner. As part of [place] newborn screening profile, all babies born in the state of [place] will have a two-tier screening process.  Specimens are first tested to determine the concentration of immunoreactive trypsinogen (IRT).  The top 5% of specimens with the highest IRT values then undergo DNA testing using a panel of over 40 common CF mutations.   We obtained a detailed family history and pregnancy history.  This is the 6th pregnancy for this patient, but the first with her current partner.  He does not have any other children.  Her children are all in good health, except for her second born, a son, who was stillborn.  She stated that he passed away at 7 months gestation due to an umbilical cord defect and nuchal cord which also resulted in significant growth restriction.  He weighed only 2 pounds and had no known birth defects or genetic conditions, though she stated that no formal genetic evaluation was done.  If his loss was related to the cord defect, then we would not expect a high chance for recurrence in other pregnancies.  In the family, she stated that she had a maternal first cousin once removed with physical disabilities due to cerebral palsy or polio and a cousin with blindness, though no medical information was known about these individuals.   Without additional information, it is difficult to assess the chance for other family members to have a similar condition.  If more is learned, we are happy to discuss this further.  The remainder of the family history was reported to be unremarkable for birth defects, mental retardation, recurrent pregnancy loss or known chromosome abnormalities.  Allison Flynn no complications or exposures that would be expected to increase the risk for birth defects.  After consideration of the options, Allison Flynn elected to proceed with first trimester screening and to decline CF carrier testing.  An ultrasound was performed at the time of the visit.  The gestational age was consistent with  12 weeks.  Fetal anatomy could not be assessed due to early gestational age.  Please refer to the ultrasound report for details of that study.  Allison Flynn was encouraged to call with questions or concerns.  We can be contacted at [phone].   I was immediately available and supervising. Erasmo Score, MD [facility]

## 2015-09-20 NOTE — Progress Notes (Addendum)
[facility] Consultation   Chief Complaint: History of IUFD  HPI: [name] is a 34 y.o. [id number] at [redacted]w[redacted]d by [redacted]w[redacted]d [place] who presents in consultation from  [place] due to her history of intrauterine fetal demise at [redacted] weeks gestation, uterine fibroids, scoliosis, and obesity.  Obstetric History:  Obstetric History   G6   P5   T4   P1   A0   TAB0   SAB0   E0   M0   L4     # Outcome Date GA Lbr Len/2nd Weight Sex Delivery Anes PTL Lv  6 Current           5 Term 10/22/13 [redacted]w[redacted]d  10 lb (4.536 kg) [address]   Y  4 Term 01/17/12 [redacted]w[redacted]d  6 lb 12 oz (3.062 kg) M Vag-Spont   Y  3 Term 05/10/05 [redacted]w[redacted]d  7 lb 12 oz (3.515 kg) F Vag-Spont  N Y  2 Preterm 05/12/04 [redacted]w[redacted]d  2 lb (0.907 kg) M Vag-Spont   FD  1 Term 02/21/99 [redacted]w[redacted]d  6 lb 12 oz (3.062 kg) M Vag-Spont  N Y    Obstetric Comments  2005 - IUFD at 54 weeks, Per patient, there was an umbilical cord defect (abnormal umbilical cord), and fetus was growth restricted;   Tight nuchal cord.  No obvious congenital anomalies.  2014 - LGA infant.    After her last delivery in 2014, she had a delayed PPH, but did not require D & C or transfusion.  Gynecologic History:  Patient's last menstrual period was 04/17/2015.  She is dated by [redacted]w[redacted]d [place].  She has a history of fibroids diagnosed last pregnancy  She reports heavy periods with prolonged, irregular cycles.   Past Medical History: Patient  has a past medical history of Scoliosis (1995).   Past Surgical History: She  has past surgical history that includes No past surgeries.   Medications: PN vitamins  Allergies: Patient has No Known Allergies.   Social History: Patient  reports that she has quit smoking. She has never used smokeless tobacco. She reports that she does not drink alcohol. She quit smoking this pregnancy.  She is a Agricultural engineer.  This is her first pregnancy with her husband.  Family History: family history includes Anemia in her  sister; Deep vein thrombosis in her maternal grandmother; Heart disease in her maternal grandmother.   Review of Systems A full 12 point review of systems was negative or as noted in the History of Present Illness.  Physical Exam:  Back - scoliosis  [place] today is consistent with [redacted]w[redacted]d gestation.  First trimester fetal anatomy appeared normal.  No fibroids were visualized.  Labs: Rh antibody screen weakly positive, but had received RhIG after episode of spotting at [redacted] weeks gestation.  Asessement: 34 yo gravida 6 para 4104 at [redacted]w[redacted]d gestation with: 1. History of intrauterine fetal demise at [redacted] weeks gestation in her second pregnancy 2. History of fibroids diagnosed last pregnancy.   3. Large for gestational age infant last pregnancy 4. Scoliosis with no previous surgery and no thoracic compromise 5. Obesity 6. First trimester bleeding 7. History of frequent UTIs during pregnancy  Recommendations: 1. History of intrauterine fetal demise at [redacted] weeks gestation in her second pregnancy without recurrent loss -- Fetal surveillance to include monthly [place] for growth starting at [redacted] weeks gestation -- Weekly NST+AFI or weekly BPP between 28 and [redacted] weeks gestation -- Twice weekly testing starting at [redacted]  weeks gestation -- Delivery no later than [redacted] weeks gestation -- Low dose ASA 81 mg per day starting now to reduce the risk of adverse pregnancy outcome(I failed to mention this during her visit and left a message for her on her voicemail.) -- She was scheduled for anatomy US here at [redacted] weeks gestation -- I would recommend that she be transferred to the care of an OBGYN by the third trimester. 2. History of fibroids diagnosed last pregnancy - none now. -- She can be monitored for fibroids at the time of periodic growth USs.    3. Large for gestational age infant last pregnancy -- She can be monitored for LGA at the time of periodic growth USs.  -- The glucose screen should be repeated at 28 weeks 4.  Scoliosis with no previous surgery and no thoracic compromise -- Anesthesia consult in the last trimester 5. Obesity -- Fetal surveillance as described above -- Low-dose aspirin starting now 6. First trimester bleeding -- Fetal surveillance as described above 7. History of frequent UTIs during pregnancy -- Macrobid 100 mg daily during pregnancy. She was given a prescription for this.  Total time spent with the patient was 40 minutes with greater than 50% spent in counseling and coordination of care.  We appreciate this consult and will be happy to be involved in the ongoing care of Ms. Pandya in anyway her providers desire.  Erasmo Score, MD [facility]

## 2015-09-27 ENCOUNTER — Telehealth: Payer: Self-pay | Admitting: Obstetrics and Gynecology

## 2015-09-27 NOTE — Telephone Encounter (Signed)
  [name] elected to undergo First Trimester screening on 09/20/2015.  To review, first trimester screening, includes nuchal translucency ultrasound screen and/or first trimester maternal serum marker screening.  The nuchal translucency has approximately an 80% detection rate for Down syndrome and can be positive for other chromosome abnormalities as well as heart defects.  When combined with a maternal serum marker screening, the detection rate is up to 90% for Down syndrome and up to 97% for trisomy 13 and 18.     The results of the First Trimester Nuchal Translucency and Biochemical Screening were within normal range.  The risk for Down syndrome is now estimated to be 8,675.  The risk for Trisomy 13/18 is estimated to be less than 1 in 10,000.  Should more definitive information be desired, we would offer amniocentesis.  Because we do not yet know the effectiveness of combined first and second trimester screening, we do not recommend a maternal serum screen to assess the chance for chromosome conditions.  However, if screening for neural tube defects is desired, maternal serum screening for AFP only can be performed between 15 and [redacted] weeks gestation.    Wilburt Finlay, MS, CGC

## 2015-10-11 NOTE — Addendum Note (Signed)
Encounter addended by: Dellia Nims, MD on: 10/11/2015  8:53 AM<BR>     Documentation filed: Charges VN, Notes Section, Visit Diagnoses

## 2015-10-14 LAB — HM PAP SMEAR: HM Pap smear: POSITIVE

## 2015-11-04 ENCOUNTER — Other Ambulatory Visit: Payer: Self-pay

## 2015-11-04 ENCOUNTER — Ambulatory Visit
Admission: RE | Admit: 2015-11-04 | Discharge: 2015-11-04 | Disposition: A | Discharge status: Home / Self Care | Payer: Medicaid Other | Source: Ambulatory Visit | Attending: Obstetrics and Gynecology | Admitting: Obstetrics and Gynecology

## 2015-11-04 VITALS — BP 128/58 | HR 86 | Temp 97.6°F | Resp 18 | Ht 64.8 in | Wt 190.4 lb

## 2015-11-04 DIAGNOSIS — O09292 Supervision of pregnancy with other poor reproductive or obstetric history, second trimester: Secondary | ICD-10-CM

## 2015-11-04 DIAGNOSIS — Z6831 Body mass index (BMI) 31.0-31.9, adult: Secondary | ICD-10-CM | POA: Insufficient documentation

## 2015-11-04 DIAGNOSIS — Z3A19 19 weeks gestation of pregnancy: Secondary | ICD-10-CM | POA: Diagnosis not present

## 2015-11-04 DIAGNOSIS — E669 Obesity, unspecified: Secondary | ICD-10-CM | POA: Diagnosis not present

## 2015-11-04 DIAGNOSIS — Z3A12 12 weeks gestation of pregnancy: Secondary | ICD-10-CM

## 2015-11-04 DIAGNOSIS — M4120 Other idiopathic scoliosis, site unspecified: Secondary | ICD-10-CM

## 2015-11-04 DIAGNOSIS — O99212 Obesity complicating pregnancy, second trimester: Secondary | ICD-10-CM | POA: Insufficient documentation

## 2015-11-04 DIAGNOSIS — Z369 Encounter for antenatal screening, unspecified: Secondary | ICD-10-CM

## 2015-11-04 DIAGNOSIS — O99210 Obesity complicating pregnancy, unspecified trimester: Secondary | ICD-10-CM

## 2015-12-02 ENCOUNTER — Ambulatory Visit
Admission: RE | Admit: 2015-12-02 | Discharge: 2015-12-02 | Disposition: A | Discharge status: Home / Self Care | Payer: Medicaid Other | Source: Ambulatory Visit | Attending: Obstetrics and Gynecology | Admitting: Obstetrics and Gynecology

## 2015-12-02 ENCOUNTER — Ambulatory Visit: Payer: Medicaid Other

## 2015-12-02 VITALS — BP 132/79 | HR 88 | Temp 97.9°F | Resp 18 | Ht 64.8 in | Wt 194.6 lb

## 2015-12-02 DIAGNOSIS — O09292 Supervision of pregnancy with other poor reproductive or obstetric history, second trimester: Secondary | ICD-10-CM

## 2015-12-02 DIAGNOSIS — D259 Leiomyoma of uterus, unspecified: Secondary | ICD-10-CM

## 2015-12-02 DIAGNOSIS — O99212 Obesity complicating pregnancy, second trimester: Secondary | ICD-10-CM

## 2015-12-05 NOTE — L&D Delivery Note (Signed)
Delivery Note At 3:36 AM a viable female was delivered via Vaginal, Spontaneous Delivery (Presentation:OA;ROA).  APGAR: 8,9; weight  3640g.   Placenta status: Intact, Spontaneous.  Cord: 3 vessels with the following complications: none.  Cord pH: NA  Called to see patient. AROM for clear fluid bulging bag of water. Small anterior lip easily reduced and Mom pushed to delivery viable female infant.  The head followed by shoulders, which delivered without difficulty, and the rest of the body.  Nuchal cord noted and easily reduced.  Baby to mom's chest.  Cord clamped and cut after > 1 min delay.  Cord blood obtained.  Placenta delivered spontaneously, intact, with a 3-vessel cord.  Minor left labial scrape without repair.  All counts correct.  Hemostasis obtained with IV pitocin and fundal massage. EBL 350 mL.    Anesthesia: Epidural  Episiotomy: None Lacerations: None Suture Repair: NA Est. Blood Loss (mL): 350  Mom to postpartum.  Baby to Couplet care / Skin to Skin.  Rod Can, CNM  This patient and plan were discussed with Dr Ilda Basset 03/21/2016

## 2016-01-06 LAB — HM HIV SCREENING LAB: HM HIV Screening: NEGATIVE

## 2016-01-13 ENCOUNTER — Ambulatory Visit
Admission: RE | Admit: 2016-01-13 | Discharge: 2016-01-13 | Disposition: A | Discharge status: Home / Self Care | Payer: Medicaid Other | Source: Ambulatory Visit | Attending: Obstetrics and Gynecology | Admitting: Obstetrics and Gynecology

## 2016-01-13 VITALS — BP 130/81 | HR 85 | Temp 97.6°F | Resp 18 | Wt 199.6 lb

## 2016-01-13 DIAGNOSIS — Z3A12 12 weeks gestation of pregnancy: Secondary | ICD-10-CM

## 2016-01-13 DIAGNOSIS — O99210 Obesity complicating pregnancy, unspecified trimester: Secondary | ICD-10-CM

## 2016-01-13 DIAGNOSIS — O99212 Obesity complicating pregnancy, second trimester: Secondary | ICD-10-CM | POA: Insufficient documentation

## 2016-01-13 DIAGNOSIS — Z6833 Body mass index (BMI) 33.0-33.9, adult: Secondary | ICD-10-CM | POA: Insufficient documentation

## 2016-01-13 DIAGNOSIS — M4120 Other idiopathic scoliosis, site unspecified: Secondary | ICD-10-CM

## 2016-01-13 DIAGNOSIS — O09292 Supervision of pregnancy with other poor reproductive or obstetric history, second trimester: Secondary | ICD-10-CM | POA: Insufficient documentation

## 2016-01-13 DIAGNOSIS — Z369 Encounter for antenatal screening, unspecified: Secondary | ICD-10-CM

## 2016-01-13 DIAGNOSIS — D259 Leiomyoma of uterus, unspecified: Secondary | ICD-10-CM

## 2016-01-13 DIAGNOSIS — Z3A29 29 weeks gestation of pregnancy: Secondary | ICD-10-CM | POA: Insufficient documentation

## 2016-01-26 ENCOUNTER — Inpatient Hospital Stay
Admission: EM | Admit: 2016-01-26 | Discharge: 2016-01-27 | DRG: 778 | Disposition: A | Discharge status: Home / Self Care | Payer: Medicaid Other | Attending: Obstetrics & Gynecology | Admitting: Obstetrics & Gynecology

## 2016-01-26 DIAGNOSIS — R102 Pelvic and perineal pain: Secondary | ICD-10-CM | POA: Diagnosis present

## 2016-01-26 DIAGNOSIS — R079 Chest pain, unspecified: Secondary | ICD-10-CM | POA: Diagnosis not present

## 2016-01-26 DIAGNOSIS — R0789 Other chest pain: Secondary | ICD-10-CM | POA: Diagnosis present

## 2016-01-26 DIAGNOSIS — O0993 Supervision of high risk pregnancy, unspecified, third trimester: Secondary | ICD-10-CM | POA: Diagnosis present

## 2016-01-26 DIAGNOSIS — Z3A31 31 weeks gestation of pregnancy: Secondary | ICD-10-CM | POA: Diagnosis not present

## 2016-01-26 HISTORY — DX: Unspecified abnormal cytological findings in specimens from vagina: R87.629

## 2016-01-26 LAB — FETAL FIBRONECTIN: FETAL FIBRONECTIN: POSITIVE — AB

## 2016-01-26 LAB — URINALYSIS COMPLETE WITH MICROSCOPIC (ARMC ONLY)
Bilirubin Urine: NEGATIVE
GLUCOSE, UA: NEGATIVE mg/dL
Hgb urine dipstick: NEGATIVE
Ketones, ur: NEGATIVE mg/dL
NITRITE: NEGATIVE
PROTEIN: NEGATIVE mg/dL
RBC / HPF: NONE SEEN RBC/hpf (ref 0–5)
SPECIFIC GRAVITY, URINE: 1.011 (ref 1.005–1.030)
pH: 6 (ref 5.0–8.0)

## 2016-01-26 LAB — BASIC METABOLIC PANEL
ANION GAP: 8 (ref 5–15)
BUN: 8 mg/dL (ref 6–20)
CALCIUM: 8.6 mg/dL — AB (ref 8.9–10.3)
CO2: 19 mmol/L — AB (ref 22–32)
Chloride: 110 mmol/L (ref 101–111)
Creatinine, Ser: 0.43 mg/dL — ABNORMAL LOW (ref 0.44–1.00)
GFR calc Af Amer: >60 mL/min (ref >60)
GFR calc non Af Amer: >60 mL/min (ref >60)
GLUCOSE: 87 mg/dL (ref 65–99)
Potassium: 3.5 mmol/L (ref 3.5–5.1)
Sodium: 137 mmol/L (ref 135–145)

## 2016-01-26 LAB — CHLAMYDIA/NGC RT PCR (ARMC ONLY)
CHLAMYDIA TR: NOT DETECTED
N gonorrhoeae: NOT DETECTED

## 2016-01-26 LAB — TROPONIN I: Troponin I: <0.03 ng/mL (ref <0.031)

## 2016-01-26 MED ORDER — LACTATED RINGERS IV SOLN
INTRAVENOUS | Status: DC
  Administered 2016-01-26: 22:00:00 via INTRAVENOUS

## 2016-01-27 NOTE — Discharge Instructions (Signed)
Call provider or return to birthplace with: ? ?1. Regular contractions ?2. Leaking of fluid from your vagina ?3. Vaginal bleeding: Bright red or heavy like a period ?4. Decreased Fetal movement  ?

## 2016-01-27 NOTE — Discharge Summary (Signed)
Allison Flynn is a 35 y.o. female. She is at [redacted]w[redacted]d gestation.  [id number]  Indication: pelvic pain/contractions? and chest heaviness.  S: Resting comfortably. no VB. Active fetal movement. Concerned about preterm labor, chest tightness/heaviness.   The pelvic pain vs contractions have been happening since last night.  She has had braxton hicks but they have not been persistent.  Over the last 24 hours this low pelvic sensation has been persistent.  Also throughout the day she notes some chest heaviness, that is intermittent and non-radiating.   When asked, she admits to high anxiety lately, due to family and situational stressors. Patient has a history of 28 week IUFD (followed by 3x Full term SVDs) as well as large fibroids and has been followed by [facility] and [facility] after transfer of care @ 29 weeks from [facility]. +intercourse within the last 72 hours.  O:  BP 113/67 mmHg  Pulse 81  Temp(Src) 97.9 F (36.6 C) (Oral)  Resp 18  Ht 5\' 2"  (1.575 m)  Wt 90.266 kg (199 lb)  BMI 36.39 kg/m2  LMP 04/17/2015 Results for orders placed or performed during the hospital encounter of 01/26/16 (from the past 48 hour(s))  Chlamydia/NGC rt PCR ([facility] only)   Collection Time: 01/26/16  8:54 PM  Result Value Ref Range   Specimen source GC/Chlam ENDOCERVICAL    Chlamydia Tr NOT DETECTED NOT DETECTED   N gonorrhoeae NOT DETECTED NOT DETECTED  Fetal fibronectin   Collection Time: 01/26/16  8:54 PM  Result Value Ref Range   Fetal Fibronectin POSITIVE (A) NEGATIVE   Appearance, FETFIB CLEAR CLEAR  Urinalysis complete, with microscopic ([facility] only)   Collection Time: 01/26/16  8:54 PM  Result Value Ref Range   Color, Urine YELLOW (A) YELLOW   APPearance CLEAR (A) CLEAR   Glucose, UA NEGATIVE NEGATIVE mg/dL   Bilirubin Urine NEGATIVE NEGATIVE   Ketones, ur NEGATIVE NEGATIVE mg/dL   Specific Gravity, Urine 1.011 1.005 - 1.030   Hgb urine dipstick NEGATIVE NEGATIVE   pH 6.0 5.0 - 8.0   Protein, ur  NEGATIVE NEGATIVE mg/dL   Nitrite NEGATIVE NEGATIVE   Leukocytes, UA 1+ (A) NEGATIVE   RBC / HPF NONE SEEN 0 - 5 RBC/hpf   WBC, UA 0-5 0 - 5 WBC/hpf   Bacteria, UA FEW (A) NONE SEEN   Squamous Epithelial / LPF 0-5 (A) NONE SEEN   Mucous PRESENT   Type and screen [place]   Collection Time: 01/26/16  8:54 PM  Result Value Ref Range   ABO/RH(D) A NEG    Antibody Screen POS    Sample Expiration [id number]   Basic metabolic panel   Collection Time: 01/26/16  9:48 PM  Result Value Ref Range   Sodium 137 135 - 145 mmol/L   Potassium 3.5 3.5 - 5.1 mmol/L   Chloride 110 101 - 111 mmol/L   CO2 19 (L) 22 - 32 mmol/L   Glucose, Bld 87 65 - 99 mg/dL   BUN 8 6 - 20 mg/dL   Creatinine, Ser 0.43 (L) 0.44 - 1.00 mg/dL   Calcium 8.6 (L) 8.9 - 10.3 mg/dL   GFR calc non Af Amer >60 >60 mL/min   GFR calc Af Amer >60 >60 mL/min   Anion gap 8 5 - 15  Troponin I   Collection Time: 01/26/16  9:48 PM  Result Value Ref Range   Troponin I <0.03 <0.031 ng/mL     Gen: NAD, AAOx3  Abd: FNTTP      Ext: Non-tender, Nonedmeatous    FHT: 135 mod + accels no decels TOCO: occasional contractions,  SVE: multiparous external cervix, closed internal os, long, thick, high. Speculum exam:  GBS, FFN, GCCT collected.  No abnormal discharge, blood, or notable dilation or thinning of cervix  EKG final pending, but NSR on survey and report.   A/P:  [id number] with concern for .   Preterm labor:  Not present; positive fetal fibronectin s/p intercourse is not diagnostic.  Caution given for progression of contractions an return to office/triage for evaluation should they continue to persist.  If cervical change occurrs, betamethasone and tocolysis may be warranted.   Chest heaviness:  troponins and EKG negative for deleterious cardiac condition.    Fetal Wellbeing: Reassuring Cat 1 tracing.  D/c home stable, precautions reviewed, follow-up as scheduled (two days).   Allison Flynn, Honor Loh

## 2016-01-27 NOTE — OB Triage Note (Signed)
Discharge delayed because of emergency on unit. Reviewed pt pre term discharge instructions, Pt verbalizes understanding. Follow up reviewed. Pt d/c home ambulatory with significant other.

## 2016-01-27 NOTE — OB Triage Note (Addendum)
Entered in error

## 2016-01-29 LAB — CULTURE, BETA STREP (GROUP B ONLY)

## 2016-02-01 LAB — TYPE AND SCREEN
ABO/RH(D): A NEG
ANTIBODY SCREEN: POSITIVE

## 2016-02-14 ENCOUNTER — Encounter
Admission: RE | Admit: 2016-02-14 | Discharge: 2016-02-14 | Disposition: A | Discharge status: Home / Self Care | Payer: Medicaid Other | Source: Ambulatory Visit | Attending: Obstetrics & Gynecology | Admitting: Obstetrics & Gynecology

## 2016-02-14 NOTE — Pre-Procedure Instructions (Signed)
SEEN BY DR Jeneen Rinks ADAMS. PATIENT WITH SOME THIGH AND LOW BACK PAIN.Marland Kitchen HIGH RISK FOR INEFFECTIVE BLOCK

## 2016-03-02 ENCOUNTER — Observation Stay
Admission: EM | Admit: 2016-03-02 | Discharge: 2016-03-02 | Disposition: A | Discharge status: Home / Self Care | Payer: Medicaid Other | Attending: Obstetrics and Gynecology | Admitting: Obstetrics and Gynecology

## 2016-03-02 DIAGNOSIS — O09293 Supervision of pregnancy with other poor reproductive or obstetric history, third trimester: Secondary | ICD-10-CM | POA: Insufficient documentation

## 2016-03-02 DIAGNOSIS — Z6837 Body mass index (BMI) 37.0-37.9, adult: Secondary | ICD-10-CM | POA: Diagnosis not present

## 2016-03-02 DIAGNOSIS — O288 Other abnormal findings on antenatal screening of mother: Secondary | ICD-10-CM | POA: Diagnosis present

## 2016-03-02 NOTE — Progress Notes (Signed)
nst reactive. Dr Georgianne Fick reviewed EFM. Will continue to monitor for another hour .

## 2016-03-02 NOTE — Final Progress Note (Signed)
Physician Final Progress Note  Patient ID: Allison Flynn MRN: [id number] DOB/AGE: Apr 29, 1981 35 y.o.  Admit date: 03/02/2016 Admitting provider: Malachy Mood, MD Discharge date: 03/02/2016   Admission Diagnoses: Non-reactive office NST for history of IUFD  Discharge Diagnoses:  Active Problems:   Non-reactive NST (non-stress test)    Consults: None  Significant Findings/ Diagnostic Studies: NST  Procedures: NST - 140, moderate, positive accels, no decels reactive NST over 2-hrs of monitoring  Discharge Condition: good  Disposition: 01-Home or Self Care  Diet: Regular diet  Discharge Activity: Activity as tolerated  Discharge Instructions    Discharge activity:  No Restrictions    Complete by:  As directed      Fetal Kick Count:  Lie on our left side for one hour after a meal, and count the number of times your baby kicks.  If it is less than 5 times, get up, move around and drink some juice.  Repeat the test 30 minutes later.  If it is still less than 5 kicks in an hour, notify your doctor.    Complete by:  As directed      LABOR:  When conractions begin, you should start to time them from the beginning of one contraction to the beginning  of the next.  When contractions are 5 - 10 minutes apart or less and have been regular for at least an hour, you should call your health care provider.    Complete by:  As directed      No sexual activity restrictions    Complete by:  As directed      Notify physician for bleeding from the vagina    Complete by:  As directed      Notify physician for blurring of vision or spots before the eyes    Complete by:  As directed      Notify physician for chills or fever    Complete by:  As directed      Notify physician for fainting spells, "black outs" or loss of consciousness    Complete by:  As directed      Notify physician for increase in vaginal discharge    Complete by:  As directed      Notify physician for leaking of  fluid    Complete by:  As directed      Notify physician for pain or burning when urinating    Complete by:  As directed      Notify physician for pelvic pressure (sudden increase)    Complete by:  As directed      Notify physician for severe or continued nausea or vomiting    Complete by:  As directed      Notify physician for sudden gushing of fluid from the vagina (with or without continued leaking)    Complete by:  As directed      Notify physician for sudden, constant, or occasional abdominal pain    Complete by:  As directed      Notify physician if baby moving less than usual    Complete by:  As directed             Medication List    TAKE these medications        aspirin 81 MG tablet  Take 81 mg by mouth daily. Reported on 01/27/2016     clotrimazole 1 % vaginal cream  Commonly known as:  GYNE-LOTRIMIN  Place 1 Applicatorful vaginally at bedtime.  nitrofurantoin (macrocrystal-monohydrate) 100 MG capsule  Commonly known as:  MACROBID  Take 100 mg by mouth daily. Reported on 01/27/2016     nitrofurantoin 100 MG capsule  Commonly known as:  MACRODANTIN  Take 1 capsule (100 mg total) by mouth 2 (two) times daily.     Prenatal Vitamins 0.8 MG tablet  Take 1 tablet by mouth daily.         Total time spent taking care of this patient: 20 minutes  Signed: Dorthula Nettles 03/02/2016, 7:26 PM

## 2016-03-02 NOTE — Plan of Care (Signed)
Pt sent over from office for monitoring. nonreactive nst at office. Pt placed on m,onitor. Instructions for nst given. Will give pt some ginger ale

## 2016-03-05 LAB — CULTURE, BETA STREP (GROUP B ONLY)

## 2016-03-20 ENCOUNTER — Inpatient Hospital Stay
Admission: EM | Admit: 2016-03-20 | Discharge: 2016-03-22 | DRG: 767 | Disposition: A | Discharge status: Home / Self Care | Payer: Medicaid Other | Attending: Obstetrics and Gynecology | Admitting: Obstetrics and Gynecology

## 2016-03-20 ENCOUNTER — Inpatient Hospital Stay: Payer: Medicaid Other | Admitting: Anesthesiology

## 2016-03-20 DIAGNOSIS — Z302 Encounter for sterilization: Secondary | ICD-10-CM

## 2016-03-20 DIAGNOSIS — Z7982 Long term (current) use of aspirin: Secondary | ICD-10-CM | POA: Diagnosis not present

## 2016-03-20 DIAGNOSIS — Z87891 Personal history of nicotine dependence: Secondary | ICD-10-CM | POA: Diagnosis not present

## 2016-03-20 DIAGNOSIS — Z3A28 28 weeks gestation of pregnancy: Secondary | ICD-10-CM

## 2016-03-20 DIAGNOSIS — Z3A38 38 weeks gestation of pregnancy: Secondary | ICD-10-CM | POA: Diagnosis not present

## 2016-03-20 DIAGNOSIS — Z8249 Family history of ischemic heart disease and other diseases of the circulatory system: Secondary | ICD-10-CM | POA: Diagnosis not present

## 2016-03-20 DIAGNOSIS — Z3A39 39 weeks gestation of pregnancy: Secondary | ICD-10-CM | POA: Diagnosis not present

## 2016-03-20 DIAGNOSIS — Z6837 Body mass index (BMI) 37.0-37.9, adult: Secondary | ICD-10-CM | POA: Diagnosis not present

## 2016-03-20 DIAGNOSIS — Z79899 Other long term (current) drug therapy: Secondary | ICD-10-CM

## 2016-03-20 DIAGNOSIS — O99214 Obesity complicating childbirth: Secondary | ICD-10-CM | POA: Diagnosis present

## 2016-03-20 DIAGNOSIS — Z87440 Personal history of urinary (tract) infections: Secondary | ICD-10-CM | POA: Diagnosis not present

## 2016-03-20 LAB — TYPE AND SCREEN
ABO/RH(D): A NEG
ANTIBODY SCREEN: NEGATIVE

## 2016-03-20 LAB — CBC
HCT: 37.1 % (ref 35.0–47.0)
HEMOGLOBIN: 12.6 g/dL (ref 12.0–16.0)
MCH: 30.7 pg (ref 26.0–34.0)
MCHC: 34.1 g/dL (ref 32.0–36.0)
MCV: 90.1 fL (ref 80.0–100.0)
PLATELETS: 154 10*3/uL (ref 150–440)
RBC: 4.11 MIL/uL (ref 3.80–5.20)
RDW: 13.7 % (ref 11.5–14.5)
WBC: 14.9 10*3/uL — ABNORMAL HIGH (ref 3.6–11.0)

## 2016-03-20 MED ORDER — ONDANSETRON HCL 4 MG/2ML IJ SOLN: 4.0000 mg | Freq: Three times a day (TID) | INTRAMUSCULAR | Status: DC | PRN

## 2016-03-20 MED ORDER — NALBUPHINE HCL 10 MG/ML IJ SOLN: 5.0000 mg | INTRAMUSCULAR | Status: DC | PRN

## 2016-03-20 MED ORDER — ACETAMINOPHEN 325 MG PO TABS: 650.0000 mg | ORAL_TABLET | ORAL | Status: DC | PRN

## 2016-03-20 MED ORDER — OXYTOCIN BOLUS FROM INFUSION
500.0000 mL | INTRAVENOUS | Status: DC
  Administered 2016-03-21: 500 mL via INTRAVENOUS

## 2016-03-20 MED ORDER — ONDANSETRON HCL 4 MG/2ML IJ SOLN
4.0000 mg | Freq: Four times a day (QID) | INTRAMUSCULAR | Status: DC | PRN
  Administered 2016-03-20: 4 mg via INTRAVENOUS
  Filled 2016-03-20: qty 2

## 2016-03-20 MED ORDER — SODIUM CHLORIDE 0.9% FLUSH: 3.0000 mL | INTRAVENOUS | Status: DC | PRN

## 2016-03-20 MED ORDER — DIPHENHYDRAMINE HCL 50 MG/ML IJ SOLN: 12.5000 mg | INTRAMUSCULAR | Status: DC | PRN

## 2016-03-20 MED ORDER — FENTANYL 2.5 MCG/ML W/ROPIVACAINE 0.2% IN NS 100 ML EPIDURAL INFUSION (ARMC-ANES)
EPIDURAL | Status: AC
  Administered 2016-03-20: 10 mL/h via EPIDURAL
  Filled 2016-03-20: qty 100

## 2016-03-20 MED ORDER — LIDOCAINE HCL (PF) 1 % IJ SOLN: 30.0000 mL | INTRAMUSCULAR | Status: DC | PRN

## 2016-03-20 MED ORDER — NALBUPHINE HCL 10 MG/ML IJ SOLN: 5.0000 mg | Freq: Once | INTRAMUSCULAR | Status: DC | PRN

## 2016-03-20 MED ORDER — TERBUTALINE SULFATE 1 MG/ML IJ SOLN: 0.2500 mg | Freq: Once | INTRAMUSCULAR | Status: DC | PRN

## 2016-03-20 MED ORDER — BUTORPHANOL TARTRATE 1 MG/ML IJ SOLN
1.0000 mg | INTRAMUSCULAR | Status: DC | PRN
Start: 2016-03-20 — End: 2016-03-21

## 2016-03-20 MED ORDER — AMMONIA AROMATIC IN INHA
RESPIRATORY_TRACT | Status: AC
  Filled 2016-03-20: qty 10

## 2016-03-20 MED ORDER — LACTATED RINGERS IV SOLN: 500.0000 mL | INTRAVENOUS | Status: DC | PRN

## 2016-03-20 MED ORDER — LIDOCAINE HCL (PF) 1 % IJ SOLN
INTRAMUSCULAR | Status: AC
  Administered 2016-03-20: 1 mL via INTRADERMAL
  Filled 2016-03-20: qty 30

## 2016-03-20 MED ORDER — LACTATED RINGERS IV SOLN
INTRAVENOUS | Status: DC
  Administered 2016-03-20 (×2): via INTRAVENOUS

## 2016-03-20 MED ORDER — NALOXONE HCL 0.4 MG/ML IJ SOLN
0.4000 mg | INTRAMUSCULAR | Status: DC | PRN
Start: 2016-03-20 — End: 2016-03-21

## 2016-03-20 MED ORDER — FENTANYL 2.5 MCG/ML W/ROPIVACAINE 0.2% IN NS 100 ML EPIDURAL INFUSION (ARMC-ANES): 10.0000 mL/h | EPIDURAL | Status: DC

## 2016-03-20 MED ORDER — BUPIVACAINE HCL (PF) 0.25 % IJ SOLN
INTRAMUSCULAR | Status: DC | PRN
  Administered 2016-03-20: 5 mL via EPIDURAL

## 2016-03-20 MED ORDER — LIDOCAINE-EPINEPHRINE (PF) 1.5 %-1:200000 IJ SOLN
INTRAMUSCULAR | Status: DC | PRN
  Administered 2016-03-20: 3 mL via EPIDURAL

## 2016-03-20 MED ORDER — NALOXONE HCL 2 MG/2ML IJ SOSY: 1.0000 ug/kg/h | PREFILLED_SYRINGE | INTRAVENOUS | Status: DC | PRN

## 2016-03-20 MED ORDER — OXYTOCIN 40 UNITS IN LACTATED RINGERS INFUSION - SIMPLE MED
2.5000 [IU]/h | INTRAVENOUS | Status: DC
Start: 2016-03-20 — End: 2016-03-21
  Filled 2016-03-20: qty 1000

## 2016-03-20 MED ORDER — OXYTOCIN 40 UNITS IN LACTATED RINGERS INFUSION - SIMPLE MED
1.0000 m[IU]/min | INTRAVENOUS | Status: DC
  Administered 2016-03-20: 1 m[IU]/min via INTRAVENOUS

## 2016-03-20 MED ORDER — DIPHENHYDRAMINE HCL 25 MG PO CAPS: 25.0000 mg | ORAL_CAPSULE | ORAL | Status: DC | PRN

## 2016-03-20 MED ORDER — MISOPROSTOL 200 MCG PO TABS
ORAL_TABLET | ORAL | Status: AC
  Filled 2016-03-20: qty 4

## 2016-03-20 MED ORDER — OXYTOCIN 10 UNIT/ML IJ SOLN
INTRAMUSCULAR | Status: AC
  Filled 2016-03-20: qty 2

## 2016-03-20 NOTE — Plan of Care (Signed)
Pt placed on portable monitors. Pt to ambulate in hallway. Pt states Opal Sidles  Will come back to recheck shortly.

## 2016-03-20 NOTE — Anesthesia Preprocedure Evaluation (Signed)
Anesthesia Evaluation  Patient identified by MRN, date of birth, ID band Patient awake    Reviewed: Allergy & Precautions, H&P , NPO status , Patient's Chart, lab work & pertinent test results  Airway Mallampati: III  TM Distance: >3 FB Neck ROM: full    Dental  (+) Poor Dentition   Pulmonary neg pulmonary ROS, neg shortness of breath, former smoker,    Pulmonary exam normal breath sounds clear to auscultation       Cardiovascular Exercise Tolerance: Good (-) hypertensionnegative cardio ROS Normal cardiovascular exam Rhythm:regular Rate:Normal     Neuro/Psych    GI/Hepatic negative GI ROS,   Endo/Other    Renal/GU   negative genitourinary   Musculoskeletal   Abdominal   Peds  Hematology negative hematology ROS (+)   Anesthesia Other Findings Past Medical History:   Scoliosis                                       1995         Vaginal Pap smear, abnormal                                 Past Surgical History:   NO PAST SURGERIES                                               Reproductive/Obstetrics (+) Pregnancy                             Anesthesia Physical Anesthesia Plan  ASA: II  Anesthesia Plan: Epidural   Post-op Pain Management:    Induction:   Airway Management Planned:   Additional Equipment:   Intra-op Plan:   Post-operative Plan:   Informed Consent: I have reviewed the patients History and Physical, chart, labs and discussed the procedure including the risks, benefits and alternatives for the proposed anesthesia with the patient or authorized representative who has indicated his/her understanding and acceptance.     Plan Discussed with: Anesthesiologist  Anesthesia Plan Comments:         Anesthesia Quick Evaluation

## 2016-03-20 NOTE — Anesthesia Procedure Notes (Signed)
Epidural Patient location during procedure: OB Start time: 03/20/2016 8:32 PM End time: 03/20/2016 8:34 PM  Staffing Anesthesiologist: Katy Fitch K Performed by: anesthesiologist   Preanesthetic Checklist Completed: patient identified, site marked, surgical consent, pre-op evaluation, timeout performed, IV checked, risks and benefits discussed and monitors and equipment checked  Epidural Patient position: sitting Prep: Betadine Patient monitoring: heart rate, continuous pulse ox and blood pressure Approach: midline Location: L4-L5 Injection technique: LOR saline  Needle:  Needle type: Tuohy  Needle gauge: 17 G Needle length: 9 cm and 9 Needle insertion depth: 7 cm Catheter type: closed end flexible Catheter size: 19 Gauge Catheter at skin depth: 11 cm Test dose: negative and 1.5% lidocaine with Epi 1:200 K  Assessment Sensory level: T10 Events: blood not aspirated, injection not painful, no injection resistance, negative IV test and no paresthesia  Additional Notes   Patient tolerated the insertion well without immediate complications.Reason for block:procedure for pain

## 2016-03-20 NOTE — H&P (Addendum)
OB History & Physical   History of Present Illness:  Chief Complaint: Advanced dilation in clinic this morning  HPI:  Allison Flynn is a 35 y.o. [id number] female at [redacted]w[redacted]d dated by U/S.  Her pregnancy has been complicated by recurrent UTIs, BMI 37, Hx of macrosomia and large fibroid, Hx of IUFD at 28 weeks, Abnormal PAP smear needs colpo postpartum.    She reports contractions.   She denies leakage of fluid.   She denies vaginal bleeding.   She reports fetal movement.    Maternal Medical History:   Past Medical History  Diagnosis Date  . Scoliosis 1995  . Vaginal Pap smear, abnormal     Past Surgical History  Procedure Laterality Date  . No past surgeries      No Known Allergies  Prior to Admission medications   Medication Sig Start Date End Date Taking? Authorizing Provider  aspirin 81 MG tablet Take 81 mg by mouth daily. Reported on 01/27/2016    Historical Provider, MD  clotrimazole (GYNE-LOTRIMIN) 1 % vaginal cream Place 1 Applicatorful vaginally at bedtime. 08/12/15   Joanne Gavel, MD  nitrofurantoin (MACRODANTIN) 100 MG capsule Take 1 capsule (100 mg total) by mouth 2 (two) times daily. 08/03/15   Carrie Mew, MD  nitrofurantoin, macrocrystal-monohydrate, (MACROBID) 100 MG capsule Take 100 mg by mouth daily. Reported on 01/27/2016    Historical Provider, MD  Prenatal Multivit-Min-Fe-FA (PRENATAL VITAMINS) 0.8 MG tablet Take 1 tablet by mouth daily. 08/03/15   Carrie Mew, MD    OB History  Gravida Para Term Preterm AB SAB TAB Ectopic Multiple Living  6 5 4 1      4     # Outcome Date GA Lbr Len/2nd Weight Sex Delivery Anes PTL Lv  6 Current           5 Term 10/22/13 [redacted]w[redacted]d  4.536 kg (10 lb) [address]   Y  4 Term 01/17/12 [redacted]w[redacted]d  3.062 kg (6 lb 12 oz) M Vag-Spont   Y  3 Term 05/10/05 [redacted]w[redacted]d  3.515 kg (7 lb 12 oz) F Vag-Spont  N Y  2 Preterm 05/12/04 [redacted]w[redacted]d  0.907 kg (2 lb) M Vag-Spont   FD  1 Term 02/21/99 [redacted]w[redacted]d  3.062 kg (6 lb 12 oz) M Vag-Spont  N Y     Obstetric Comments  2005 - IUFD at 2 weeks, Per patient, there was an umbilical cord defect (abnormal umbilical cord), and fetus was growth restricted;   Tight nuchal cord.  No obvious congenital anomalies.  2014 - LGA infant.      Prenatal care site: [facility]  Social History: She  reports that she has quit smoking. She has never used smokeless tobacco. She reports that she does not drink alcohol.  Family History: family history includes Anemia in her sister; Deep vein thrombosis in her maternal grandmother; Heart disease in her maternal grandmother.   Review of Systems: Negative x 10 systems reviewed except as noted in the HPI.   TDAP/FLU UTD given at health department  Physical Exam:  Vital Signs: BP 130/75 mmHg  Pulse 94  Temp(Src) 98.1 F (36.7 C)  LMP 04/17/2015 General: no acute distress.  HEENT: normocephalic, atraumatic Heart: regular rate & rhythm.  No murmurs/rubs/gallops Lungs: clear to auscultation bilaterally Abdomen: soft, gravid, non-tender;  EFW: 7.5 to 8 pounds Pelvic:   External: Normal external female genitalia  Cervix:  5.5 /  90 /  -2  Extremities: non-tender, symmetric, mild edema bilaterally.  DTRs:  2+ bilaterally Neurologic: Alert & oriented x 3.    Pertinent Results:  Prenatal Labs: Blood type/Rh A negative  Antibody screen negative  Rubella Immune  Varicella Immune    RPR Non Reactive  HBsAg negative  HIV negative  GC negative  Chlamydia negative  Genetic screening AFP negative  1 hour GTT 119  3 hour GTT NA  GBS negative on    Baseline FHR: 135 beats/min   Variability: moderate   Accelerations: present   Decelerations: absent Contractions: present frequency: 2-4 Overall assessment: Category I    Assessment:  Allison Flynn is a 35 y.o. [phone] female at [redacted]w[redacted]d with advanced dilation in clinic and history of painless dilation and precipitous delivery.   Plan:  1. Admit to Labor & Delivery  2. CBC, T&S, Clrs,  IVF 3. GBS negative.   4. Fetal well-being: Category I 5. Augmentation as needed  Aries Kasa, CNM  This patient and plan were discussed with Dr Ilda Basset 03/20/2016

## 2016-03-20 NOTE — Progress Notes (Signed)
  Labor Progress Note   35 y.o. [id number] @ [redacted]w[redacted]d , admitted for  Pregnancy, Labor Management. Advanced dilation in clinic  Subjective:  Earlier in the day when pt arrived to L&D she was comfortable and beginning to feel some cramping with contractions. Check at 3 pm pt states contractions are feeling stronger.  Objective:  BP 130/75 mmHg  Pulse 94  Temp(Src) 98.1 F (36.7 C)  LMP 04/17/2015 Abd: mild Extr: trace to 1+ bilateral pedal edema SVE: CERVIX: 5.5 cm dilated, 90 effaced, -2 station  EFM: FHR: 135 bpm, variability: moderate,  accelerations:  Present,  decelerations:  Absent Toco: Frequency: Every 2-4 minutes Labs: I have reviewed the patient's lab results.   Assessment & Plan:  [id number] @ [redacted]w[redacted]d, admitted for  Pregnancy and Labor/Delivery Management   1. Pain management: none. 2. FWB: FHT category I.  3. ID: GBS negative 4. Labor management: augmentation prn All discussed with patient, see orders  Kaiesha Tonner, CNM   This patient and plan were discussed with Dr Leonides Schanz 03/20/2016

## 2016-03-20 NOTE — Plan of Care (Signed)
Pt sent over from office. sve by dr ward. Was told she was 5cm dilated and would have the baby today

## 2016-03-21 ENCOUNTER — Encounter
Admission: EM | Disposition: A | Discharge status: Home / Self Care | Payer: Self-pay | Source: Home / Self Care | Attending: Obstetrics and Gynecology

## 2016-03-21 ENCOUNTER — Inpatient Hospital Stay: Payer: Medicaid Other | Admitting: Anesthesiology

## 2016-03-21 ENCOUNTER — Encounter: Payer: Self-pay | Admitting: Certified Nurse Midwife

## 2016-03-21 HISTORY — PX: TUBAL LIGATION: SHX77

## 2016-03-21 LAB — RPR: RPR: NONREACTIVE

## 2016-03-21 SURGERY — LIGATION, FALLOPIAN TUBE, POSTPARTUM
Anesthesia: General | Wound class: Clean Contaminated

## 2016-03-21 MED ORDER — DEXAMETHASONE SODIUM PHOSPHATE 10 MG/ML IJ SOLN
INTRAMUSCULAR | Status: DC | PRN
  Administered 2016-03-21: 4 mg via INTRAVENOUS

## 2016-03-21 MED ORDER — LIDOCAINE HCL (CARDIAC) 20 MG/ML IV SOLN
INTRAVENOUS | Status: DC | PRN
  Administered 2016-03-21: 30 mg via INTRAVENOUS

## 2016-03-21 MED ORDER — OXYCODONE HCL 5 MG PO TABS
5.0000 mg | ORAL_TABLET | ORAL | Status: DC | PRN
  Administered 2016-03-21 – 2016-03-22 (×4): 5 mg via ORAL
  Filled 2016-03-21 (×3): qty 1

## 2016-03-21 MED ORDER — COCONUT OIL OIL
1.0000 "application " | TOPICAL_OIL | Status: DC | PRN
  Administered 2016-03-21: 1 via TOPICAL
  Filled 2016-03-21: qty 120

## 2016-03-21 MED ORDER — GLYCOPYRROLATE 0.2 MG/ML IJ SOLN
INTRAMUSCULAR | Status: DC | PRN
  Administered 2016-03-21: 0.4 mg via INTRAVENOUS

## 2016-03-21 MED ORDER — SODIUM CHLORIDE FLUSH 0.9 % IV SOLN
INTRAVENOUS | Status: AC
  Filled 2016-03-21: qty 3

## 2016-03-21 MED ORDER — BUPIVACAINE HCL 0.5 % IJ SOLN
INTRAMUSCULAR | Status: DC | PRN
Start: 2016-03-21 — End: 2016-03-21
  Administered 2016-03-21: 3 mL

## 2016-03-21 MED ORDER — PRENATAL MULTIVITAMIN CH
1.0000 | ORAL_TABLET | Freq: Every day | ORAL | Status: DC
  Administered 2016-03-21 – 2016-03-22 (×2): 1 via ORAL
  Filled 2016-03-21 (×2): qty 1

## 2016-03-21 MED ORDER — SUCCINYLCHOLINE CHLORIDE 20 MG/ML IJ SOLN
INTRAMUSCULAR | Status: DC | PRN
  Administered 2016-03-21: 100 mg via INTRAVENOUS

## 2016-03-21 MED ORDER — DIPHENHYDRAMINE HCL 25 MG PO CAPS: 25.0000 mg | ORAL_CAPSULE | Freq: Four times a day (QID) | ORAL | Status: DC | PRN

## 2016-03-21 MED ORDER — ONDANSETRON HCL 4 MG/2ML IJ SOLN
INTRAMUSCULAR | Status: DC | PRN
  Administered 2016-03-21: 4 mg via INTRAVENOUS

## 2016-03-21 MED ORDER — FENTANYL CITRATE (PF) 100 MCG/2ML IJ SOLN
INTRAMUSCULAR | Status: AC
Start: 2016-03-21 — End: 2016-03-22
  Filled 2016-03-21: qty 2

## 2016-03-21 MED ORDER — TETANUS-DIPHTH-ACELL PERTUSSIS 5-2.5-18.5 LF-MCG/0.5 IM SUSP: 0.5000 mL | Freq: Once | INTRAMUSCULAR | Status: DC

## 2016-03-21 MED ORDER — LACTATED RINGERS IV SOLN
INTRAVENOUS | Status: DC | PRN
  Administered 2016-03-21: 18:00:00 via INTRAVENOUS

## 2016-03-21 MED ORDER — WITCH HAZEL-GLYCERIN EX PADS: 1.0000 "application " | MEDICATED_PAD | CUTANEOUS | Status: DC | PRN

## 2016-03-21 MED ORDER — ACETAMINOPHEN 325 MG PO TABS: 650.0000 mg | ORAL_TABLET | ORAL | Status: DC | PRN

## 2016-03-21 MED ORDER — ONDANSETRON HCL 4 MG PO TABS: 4.0000 mg | ORAL_TABLET | ORAL | Status: DC | PRN

## 2016-03-21 MED ORDER — BENZOCAINE-MENTHOL 20-0.5 % EX AERO: 1.0000 "application " | INHALATION_SPRAY | CUTANEOUS | Status: DC | PRN

## 2016-03-21 MED ORDER — NEOSTIGMINE METHYLSULFATE 10 MG/10ML IV SOLN
INTRAVENOUS | Status: DC | PRN
  Administered 2016-03-21: 3 mg via INTRAVENOUS

## 2016-03-21 MED ORDER — ONDANSETRON HCL 4 MG/2ML IJ SOLN: 4.0000 mg | INTRAMUSCULAR | Status: DC | PRN

## 2016-03-21 MED ORDER — DIBUCAINE 1 % RE OINT: 1.0000 "application " | TOPICAL_OINTMENT | RECTAL | Status: DC | PRN

## 2016-03-21 MED ORDER — PROPOFOL 10 MG/ML IV BOLUS
INTRAVENOUS | Status: DC | PRN
  Administered 2016-03-21: 150 mg via INTRAVENOUS

## 2016-03-21 MED ORDER — FENTANYL CITRATE (PF) 100 MCG/2ML IJ SOLN
25.0000 ug | INTRAMUSCULAR | Status: DC | PRN
  Administered 2016-03-21 (×4): 25 ug via INTRAVENOUS

## 2016-03-21 MED ORDER — ONDANSETRON HCL 4 MG/2ML IJ SOLN: 4.0000 mg | Freq: Once | INTRAMUSCULAR | Status: DC | PRN

## 2016-03-21 MED ORDER — SENNOSIDES-DOCUSATE SODIUM 8.6-50 MG PO TABS
2.0000 | ORAL_TABLET | ORAL | Status: DC
  Filled 2016-03-21: qty 2

## 2016-03-21 MED ORDER — ROCURONIUM BROMIDE 100 MG/10ML IV SOLN
INTRAVENOUS | Status: DC | PRN
  Administered 2016-03-21: 20 mg via INTRAVENOUS

## 2016-03-21 MED ORDER — MIDAZOLAM HCL 2 MG/2ML IJ SOLN
INTRAMUSCULAR | Status: DC | PRN
  Administered 2016-03-21: 2 mg via INTRAVENOUS

## 2016-03-21 MED ORDER — OXYCODONE HCL 5 MG PO TABS
10.0000 mg | ORAL_TABLET | ORAL | Status: DC | PRN
  Administered 2016-03-21: 10 mg via ORAL
  Filled 2016-03-21: qty 2

## 2016-03-21 MED ORDER — LACTATED RINGERS IV SOLN
INTRAVENOUS | Status: DC
  Administered 2016-03-21: 12:00:00 via INTRAVENOUS

## 2016-03-21 MED ORDER — FENTANYL CITRATE (PF) 100 MCG/2ML IJ SOLN
INTRAMUSCULAR | Status: DC | PRN
  Administered 2016-03-21: 100 ug via INTRAVENOUS

## 2016-03-21 MED ORDER — IBUPROFEN 600 MG PO TABS
600.0000 mg | ORAL_TABLET | Freq: Four times a day (QID) | ORAL | Status: DC
  Administered 2016-03-21 – 2016-03-22 (×5): 600 mg via ORAL
  Filled 2016-03-21 (×5): qty 1

## 2016-03-21 MED ORDER — SIMETHICONE 80 MG PO CHEW: 80.0000 mg | CHEWABLE_TABLET | ORAL | Status: DC | PRN

## 2016-03-21 SURGICAL SUPPLY — 27 items
BLADE SURG SZ11 CARB STEEL (BLADE) ×3 IMPLANT
CATH TRAY METER 16FR LF (MISCELLANEOUS) IMPLANT
CHLORAPREP W/TINT 26ML (MISCELLANEOUS) ×3 IMPLANT
DRAPE LAPAROTOMY 100X77 ABD (DRAPES) ×3 IMPLANT
DRESSING TELFA 4X3 1S ST N-ADH (GAUZE/BANDAGES/DRESSINGS) ×3 IMPLANT
DRSG TEGADERM 2-3/8X2-3/4 SM (GAUZE/BANDAGES/DRESSINGS) ×3 IMPLANT
ELECT CAUTERY BLADE 6.4 (BLADE) ×3 IMPLANT
ELECT REM PT RETURN 9FT ADLT (ELECTROSURGICAL) ×3
ELECTRODE REM PT RTRN 9FT ADLT (ELECTROSURGICAL) ×1 IMPLANT
GLOVE BIOGEL PI IND STRL 6.5 (GLOVE) ×1 IMPLANT
GLOVE BIOGEL PI INDICATOR 6.5 (GLOVE) ×2
GLOVE SURG SYN 6.5 ES PF (GLOVE) ×3 IMPLANT
GOWN STRL REUS W/ TWL LRG LVL3 (GOWN DISPOSABLE) ×2 IMPLANT
GOWN STRL REUS W/TWL LRG LVL3 (GOWN DISPOSABLE) ×4
KIT RM TURNOVER CYSTO AR (KITS) ×3 IMPLANT
LABEL OR SOLS (LABEL) IMPLANT
LIQUID BAND (GAUZE/BANDAGES/DRESSINGS) ×3 IMPLANT
NDL SAFETY 22GX1.5 (NEEDLE) ×3 IMPLANT
NS IRRIG 500ML POUR BTL (IV SOLUTION) ×3 IMPLANT
PACK BASIN MINOR ARMC (MISCELLANEOUS) ×3 IMPLANT
RETRACTOR WOUND ALXS 18CM SML (MISCELLANEOUS) ×1 IMPLANT
RTRCTR WOUND ALEXIS O 18CM SML (MISCELLANEOUS) ×3
SPONGE LAP 18X18 5 PK (GAUZE/BANDAGES/DRESSINGS) ×3 IMPLANT
SUT MNCRL AB 4-0 PS2 18 (SUTURE) ×3 IMPLANT
SUT PLAIN GUT 0 (SUTURE) ×6 IMPLANT
SUT VIC AB 2-0 UR6 27 (SUTURE) ×3 IMPLANT
SYRINGE 10CC LL (SYRINGE) ×3 IMPLANT

## 2016-03-21 NOTE — Discharge Summary (Signed)
OB Discharge Summary  Patient Name: Allison Flynn DOB: 1981/03/20 MRN: [id number]  Date of admission: 03/20/2016 Delivering MD: Rod Can, CNM Date of Delivery: 03/21/2016  Date of discharge: 03/22/2016  Admitting diagnosis: 72 wks preg induction DESIRE PERMANENT STERILITY Intrauterine pregnancy: 38 6/7    Secondary diagnosis: recurrent UTIs, obesity bmi 37, desires sterility     Discharge diagnosis: Term Pregnancy Delivered                                                                                                Post partum procedures:postpartum tubal ligation  Augmentation: AROM and Pitocin  Complications: None  Hospital course:  Induction of Labor With Vaginal Delivery   35 y.o. yo [phone] at Unknown was admitted to the hospital 03/20/2016 for induction of labor.  Indication for induction: Favorable cervix at term.  Patient had an uncomplicated labor course as follows: Membrane Rupture Time/Date: 3:25 AM ,03/21/2016   Intrapartum Procedures: Episiotomy: None [1]                                         Lacerations:  None [1]  Patient had delivery of a Viable infant.  Information for the patient's newborn:  Alaeyah, Haghighi [id number]  Delivery Method: Vag-Spont    03/21/2016  Details of delivery can be found in separate delivery note.  Patient had a routine postpartum course. Later on day of delivery she underwent a postpartum bilateral tubal ligation, by Dr. Glennon Mac, which occurred without difficulty.  Patient is discharged home on PPD #1 per her request. She is afebrile, voiding without difficulty, tolerating a regular diet and ambulating without assistance. Her pain is controlled with oral analgesics. She received Rhogam 03/22/2016   Physical exam  Filed Vitals:   03/21/16 2231 03/22/16 0026 03/22/16 0439 03/22/16 0726  BP: 120/68 108/74 111/57 105/61  Pulse: 72 75 89 75  Temp: 97.5 F (36.4 C) 98 F (36.7 C) 98 F (36.7 C) 97.8 F (36.6 C)   TempSrc: Oral Oral Oral Oral  Resp: 18 18 18 18   SpO2: 98% 100% 100% 98%   General: alert, cooperative and no distress Lochia: appropriate Uterine Fundus: firm/ U-1/ML/NT Incision: BTL incision has clean dressing DVT Evaluation: No evidence of DVT seen on physical exam.  Labs: Lab Results  Component Value Date   WBC 12.4* 03/22/2016   HGB 9.4* 03/22/2016   HCT 26.7* 03/22/2016   MCV 91.1 03/22/2016   PLT 162 03/22/2016    Discharge instruction: per After Visit Summary.  Medications:    Medication List    STOP taking these medications        aspirin 81 MG tablet     clotrimazole 1 % vaginal cream  Commonly known as:  GYNE-LOTRIMIN     nitrofurantoin (macrocrystal-monohydrate) 100 MG capsule  Commonly known as:  MACROBID     nitrofurantoin 100 MG capsule  Commonly known as:  MACRODANTIN      TAKE these medications        dibucaine  1 % Oint  Commonly known as:  NUPERCAINAL  Place 1 application rectally as needed for hemorrhoids.     FUSION PLUS Caps  Take 1 capsule by mouth daily.     ibuprofen 600 MG tablet  Commonly known as:  ADVIL,MOTRIN  Take 1 tablet (600 mg total) by mouth every 6 (six) hours as needed for mild pain, moderate pain or cramping.     oxyCODONE-acetaminophen 5-325 MG tablet  Commonly known as:  PERCOCET/ROXICET  Take 1-2 tablets by mouth every 6 (six) hours as needed for moderate pain or severe pain.     Prenatal Vitamins 0.8 MG tablet  Take 1 tablet by mouth daily.        Diet: routine diet  Activity: Advance as tolerated. Pelvic rest for 6 weeks.   Outpatient follow up: No Follow-up on file.  Postpartum contraception: Tubal Ligation Rhogam Given postpartum: yes Rubella vaccine given postpartum: no Varicella vaccine given postpartum: no TDaP given antepartum or postpartum: TDaP given 07/09/15 Flu vaccine given antepartum or postpartum: NA  Newborn Data: Live born female [place] Birth Weight: 3640g APGAR: 8,9    Baby  Feeding: Breast  Disposition:home with mother  Dalia Heading, CNM  This patient and plan were discussed with Dr Ilda Basset 03/22/2016

## 2016-03-21 NOTE — Progress Notes (Signed)
Day of Delivery   Desires BTL. Has signed BTL 30 day papers in February and that is in chart. Ate breakfast this AM at 0830, but has been NPO since then except for meds for cramping Discussed with Dr Leonides Schanz and she has scheduled surgery for this evening IV fluids at 125 to maintain hydration Vital signs and bleeding have been stable  A: Preop for ppBTL today  P: Continue NPO Void on call to OR Dr Leonides Schanz or Glennon Mac will be up to talk with patient about surgery  Johari Bennetts, CNM

## 2016-03-21 NOTE — Op Note (Signed)
Op Note Postpartum Tubal Ligation  Pre-Op Diagnosis: multiparity, desires permanent sterilization  Post-Op Diagnosis: multiparity, desires permanent sterilization  Procedures:  Postpartum tubal ligation via Pomeroy method  Primary Surgeon: Prentice Docker, MD  EBL: 10 ml   IVF: 500 mL   Specimens: portion of right and left fallopian tubes  Drains: None  Complications: None   Disposition: PACU   Condition: Stable   Findings: normal appearing bilateral fallopian tubes  Indication: The patient is a 35 y.o. [id number]  who is postpartum day 0 status post spontaneous vaginal delivery.  She has been counseled extensively regarding risks, benefits, and alternatives to tubal ligation, including non-permanent forms of contraception that are equivalent in efficacy with potentially better side effects.  She has been advised that there is a failure rate of ~5 in every 1,000 tubal ligations per year with an increased risk of ectopic pregnancy should pregnancy occur.   Procedure Summary:  The patient was taken to the operating room where general anesthesia was administered and found to be adequate. After timeout was called a small transverse, infraumbilical incision was made with the scalpel. The incision was carried down through the fascia until the peritoneum was identified and entered. The peritoneum was noted to be free of any adhesions and the incision was then extended.  The patient's left fallopian tube was identified, brought incision, and grasped with a Babcock clamp. The tube was then followed out to the fimbria. The Babcock clamp was then used to grasp the tube approximately 4 cm from the cornual region. A 3 cm segment of tube was then ligated with the 2 free ties of plain gut, and excised. Good hemostasis was noted and the tube was returned to the abdomen. The right fallopian tube was then ligated, and a 3 cm segment excised in a similar fashion. Excellent hemostasis was noted, and the  tube returned to the abdomen.  The peritoneum and fascia were closed in a single layer using 3-0 Vicryl. The skin was closed in a subcuticular fashion using 4-0 vicryl, undyed. The closure was also closed with Dermabond.  Sponge, lap, needle, and instrument counts were correct x 2.  VTE prophylaxis: SCDs. Antibiotic prophylaxis: none indicated. The patient tolerated the procedure well and was taken to the PACU in stable condition.   Prentice Docker, MD 03/21/2016 7:24 PM

## 2016-03-21 NOTE — Progress Notes (Signed)
Discussed patient's desire for permanent sterilization.  She strongly desires to have no other children. We discussed the risks, benefits, and alternatives to tubal ligation, including non-permanent forms of contraception that are equivalent in efficacy with possibly better side effects.  We also discussed the risk of failure of BTL at about 5/1,000 surgeries per year with increased risk of ectopic pregnancy should pregnancy occur.   She voiced understanding of these topics in addition to those related to the procedure itself (bleeding, infection, damage to nearby organs, like bowel and bladder, nerves and blood vessels).  She desires to proceed with the surgery.  Prentice Docker, MD 03/21/2016 6:20 PM

## 2016-03-21 NOTE — Anesthesia Procedure Notes (Signed)
Procedure Name: Intubation Date/Time: 03/21/2016 6:27 PM Performed by: Lendon Colonel Pre-anesthesia Checklist: Patient identified, Emergency Drugs available, Suction available, Patient being monitored and Timeout performed Patient Re-evaluated:Patient Re-evaluated prior to inductionOxygen Delivery Method: Circle system utilized Preoxygenation: Pre-oxygenation with 100% oxygen Intubation Type: IV induction, Rapid sequence and Cricoid Pressure applied Laryngoscope Size: Miller and 2 Grade View: Grade I Tube type: Oral Number of attempts: 1 Airway Equipment and Method: Stylet Placement Confirmation: ETT inserted through vocal cords under direct vision,  positive ETCO2 and breath sounds checked- equal and bilateral Secured at: 20 cm Tube secured with: Tape Dental Injury: Teeth and Oropharynx as per pre-operative assessment

## 2016-03-21 NOTE — Anesthesia Preprocedure Evaluation (Signed)
Anesthesia Evaluation  Patient identified by MRN, date of birth, ID band Patient awake    Reviewed: Allergy & Precautions, H&P , NPO status , Patient's Chart, lab work & pertinent test results, reviewed documented beta blocker date and time   Airway Mallampati: II  TM Distance: >3 FB Neck ROM: full    Dental  (+) Teeth Intact   Pulmonary neg pulmonary ROS, former smoker,    Pulmonary exam normal        Cardiovascular negative cardio ROS Normal cardiovascular exam Rhythm:regular Rate:Normal     Neuro/Psych negative neurological ROS  negative psych ROS   GI/Hepatic negative GI ROS, Neg liver ROS,   Endo/Other  negative endocrine ROS  Renal/GU negative Renal ROS  negative genitourinary   Musculoskeletal   Abdominal   Peds  Hematology negative hematology ROS (+)   Anesthesia Other Findings Past Medical History:   Scoliosis                                       1995         Vaginal Pap smear, abnormal                                Past Surgical History:   NO PAST SURGERIES                                             Reproductive/Obstetrics negative OB ROS                             Anesthesia Physical Anesthesia Plan  ASA: II  Anesthesia Plan: General ETT   Post-op Pain Management:    Induction:   Airway Management Planned:   Additional Equipment:   Intra-op Plan:   Post-operative Plan:   Informed Consent: I have reviewed the patients History and Physical, chart, labs and discussed the procedure including the risks, benefits and alternatives for the proposed anesthesia with the patient or authorized representative who has indicated his/her understanding and acceptance.   Dental Advisory Given  Plan Discussed with: CRNA  Anesthesia Plan Comments:         Anesthesia Quick Evaluation

## 2016-03-21 NOTE — Transfer of Care (Signed)
Immediate Anesthesia Transfer of Care Note  Patient: Allison Flynn  Procedure(s) Performed: Procedure(s): POST PARTUM TUBAL LIGATION (N/A)  Patient Location: PACU  Anesthesia Type:General  Level of Consciousness: awake, alert , oriented and patient cooperative  Airway & Oxygen Therapy: Patient Spontanous Breathing and Patient connected to face mask oxygen  Post-op Assessment: Report given to RN and Post -op Vital signs reviewed and stable  Post vital signs: Reviewed and stable  Last Vitals:  Filed Vitals:   03/21/16 1606 03/21/16 1926  BP: 99/77 118/57  Pulse: 79 70  Temp: 36.6 C 36.1 C  Resp: 18 12    Complications: No apparent anesthesia complications

## 2016-03-22 ENCOUNTER — Encounter: Payer: Self-pay | Admitting: Obstetrics and Gynecology

## 2016-03-22 LAB — CBC
HEMATOCRIT: 26.7 % — AB (ref 35.0–47.0)
HEMOGLOBIN: 9.4 g/dL — AB (ref 12.0–16.0)
MCH: 32 pg (ref 26.0–34.0)
MCHC: 35.1 g/dL (ref 32.0–36.0)
MCV: 91.1 fL (ref 80.0–100.0)
Platelets: 162 10*3/uL (ref 150–440)
RBC: 2.93 MIL/uL — ABNORMAL LOW (ref 3.80–5.20)
RDW: 14.3 % (ref 11.5–14.5)
WBC: 12.4 10*3/uL — AB (ref 3.6–11.0)

## 2016-03-22 LAB — FETAL SCREEN: Fetal Screen: NEGATIVE

## 2016-03-22 MED ORDER — FERROUS FUMARATE 324 (106 FE) MG PO TABS
1.0000 | ORAL_TABLET | Freq: Every day | ORAL | Status: DC
  Filled 2016-03-22: qty 1

## 2016-03-22 MED ORDER — FUSION PLUS PO CAPS: 1.0000 | ORAL_CAPSULE | Freq: Every day | ORAL | Status: DC

## 2016-03-22 MED ORDER — PRENATAL MULTIVITAMIN CH: 1.0000 | ORAL_TABLET | Freq: Every day | ORAL | Status: DC

## 2016-03-22 MED ORDER — IBUPROFEN 600 MG PO TABS: 600.0000 mg | ORAL_TABLET | Freq: Four times a day (QID) | ORAL | Status: DC | PRN

## 2016-03-22 MED ORDER — OXYCODONE-ACETAMINOPHEN 5-325 MG PO TABS: 1.0000 | ORAL_TABLET | Freq: Four times a day (QID) | ORAL | Status: DC | PRN

## 2016-03-22 MED ORDER — DOCUSATE SODIUM 100 MG PO CAPS
100.0000 mg | ORAL_CAPSULE | Freq: Every day | ORAL | Status: DC
  Filled 2016-03-22: qty 1

## 2016-03-22 MED ORDER — DIBUCAINE 1 % RE OINT
1.0000 | TOPICAL_OINTMENT | RECTAL | Status: DC | PRN
Start: 2016-03-22 — End: 2016-06-05

## 2016-03-22 MED ORDER — RHO D IMMUNE GLOBULIN 1500 UNIT/2ML IJ SOSY
300.0000 ug | PREFILLED_SYRINGE | Freq: Once | INTRAMUSCULAR | Status: AC
  Administered 2016-03-22: 300 ug via INTRAVENOUS
  Filled 2016-03-22: qty 2

## 2016-03-22 NOTE — Anesthesia Post-op Follow-up Note (Signed)
  Anesthesia Pain Follow-up Note  Patient: Allison Flynn  Day #: 1  Date of Follow-up: 03/22/2016 Time: 9:15 AM  Last Vitals:  Filed Vitals:   03/22/16 0439 03/22/16 0726  BP: 111/57 105/61  Pulse: 89 75  Temp: 36.7 C 36.6 C  Resp: 18 18    Level of Consciousness: alert  Pain: mild   Side Effects:None  Catheter Site Exam:clean  Plan: D/C from anesthesia care  [place]

## 2016-03-22 NOTE — Progress Notes (Signed)
Patient understands all discharge instructions and the need to make follow up appointments. Patient discharge via wheelchair with auxillary. 

## 2016-03-22 NOTE — Anesthesia Postprocedure Evaluation (Signed)
Anesthesia Post Note  Patient: Allison Flynn  Procedure(s) Performed: * No procedures listed *  Patient location during evaluation: [place] [facility] Anesthesia Type: Epidural Level of consciousness: awake and alert Pain management: pain level controlled Vital Signs Assessment: post-procedure vital signs reviewed and stable Respiratory status: spontaneous breathing, nonlabored ventilation and respiratory function stable Cardiovascular status: stable Postop Assessment: no headache, no backache and epidural receding Anesthetic complications: no    Last Vitals:  Filed Vitals:   03/22/16 0439 03/22/16 0726  BP: 111/57 105/61  Pulse: 89 75  Temp: 36.7 C 36.6 C  Resp: 18 18    Last Pain:  Filed Vitals:   03/22/16 0833  PainSc: 0-No pain                 Precious Haws Piscitello

## 2016-03-22 NOTE — Progress Notes (Signed)
I have verified all charting done by Gaspar Garbe (student RN). Alecia Lemming RN

## 2016-03-23 LAB — RHOGAM INJECTION: Unit division: 0

## 2016-03-23 LAB — SURGICAL PATHOLOGY

## 2016-04-03 NOTE — Anesthesia Postprocedure Evaluation (Signed)
Anesthesia Post Note  Patient: Allison Flynn  Procedure(s) Performed: Procedure(s) (LRB): POST PARTUM TUBAL LIGATION (N/A)  Patient location during evaluation: PACU Anesthesia Type: General Level of consciousness: awake and alert Pain management: pain level controlled Vital Signs Assessment: post-procedure vital signs reviewed and stable Respiratory status: spontaneous breathing, nonlabored ventilation, respiratory function stable and patient connected to nasal cannula oxygen Cardiovascular status: blood pressure returned to baseline and stable Postop Assessment: no signs of nausea or vomiting Anesthetic complications: no    Last Vitals:  Filed Vitals:   03/22/16 0439 03/22/16 0726  BP: 111/57 105/61  Pulse: 89 75  Temp: 36.7 C 36.6 C  Resp: 18 18    Last Pain:  Filed Vitals:   03/22/16 0833  PainSc: 0-No pain                 Molli Barrows

## 2016-05-06 ENCOUNTER — Encounter: Payer: Self-pay | Admitting: Emergency Medicine

## 2016-05-06 ENCOUNTER — Emergency Department: Payer: Medicaid Other

## 2016-05-06 ENCOUNTER — Emergency Department
Admission: EM | Admit: 2016-05-06 | Discharge: 2016-05-06 | Disposition: A | Discharge status: Home / Self Care | Payer: Medicaid Other | Attending: Emergency Medicine | Admitting: Emergency Medicine

## 2016-05-06 DIAGNOSIS — Z79899 Other long term (current) drug therapy: Secondary | ICD-10-CM | POA: Insufficient documentation

## 2016-05-06 DIAGNOSIS — R1013 Epigastric pain: Secondary | ICD-10-CM | POA: Insufficient documentation

## 2016-05-06 DIAGNOSIS — R1011 Right upper quadrant pain: Secondary | ICD-10-CM | POA: Diagnosis present

## 2016-05-06 DIAGNOSIS — Z87891 Personal history of nicotine dependence: Secondary | ICD-10-CM | POA: Diagnosis not present

## 2016-05-06 LAB — URINALYSIS COMPLETE WITH MICROSCOPIC (ARMC ONLY)
Bilirubin Urine: NEGATIVE
Glucose, UA: NEGATIVE mg/dL
HGB URINE DIPSTICK: NEGATIVE
KETONES UR: NEGATIVE mg/dL
NITRITE: NEGATIVE
PH: 5 (ref 5.0–8.0)
PROTEIN: NEGATIVE mg/dL
SPECIFIC GRAVITY, URINE: 1.018 (ref 1.005–1.030)

## 2016-05-06 LAB — COMPREHENSIVE METABOLIC PANEL
ALBUMIN: 4.1 g/dL (ref 3.5–5.0)
ALK PHOS: 62 U/L (ref 38–126)
ALT: 39 U/L (ref 14–54)
ANION GAP: 9 (ref 5–15)
AST: 30 U/L (ref 15–41)
BILIRUBIN TOTAL: 0.5 mg/dL (ref 0.3–1.2)
BUN: 15 mg/dL (ref 6–20)
CALCIUM: 9.1 mg/dL (ref 8.9–10.3)
CO2: 22 mmol/L (ref 22–32)
Chloride: 108 mmol/L (ref 101–111)
Creatinine, Ser: 0.64 mg/dL (ref 0.44–1.00)
GFR calc non Af Amer: >60 mL/min (ref >60)
GLUCOSE: 108 mg/dL — AB (ref 65–99)
POTASSIUM: 3.7 mmol/L (ref 3.5–5.1)
SODIUM: 139 mmol/L (ref 135–145)
TOTAL PROTEIN: 7.2 g/dL (ref 6.5–8.1)

## 2016-05-06 LAB — CBC
HEMATOCRIT: 35.1 % (ref 35.0–47.0)
HEMOGLOBIN: 12.3 g/dL (ref 12.0–16.0)
MCH: 30.5 pg (ref 26.0–34.0)
MCHC: 35 g/dL (ref 32.0–36.0)
MCV: 86.9 fL (ref 80.0–100.0)
Platelets: 252 10*3/uL (ref 150–440)
RBC: 4.04 MIL/uL (ref 3.80–5.20)
RDW: 12.8 % (ref 11.5–14.5)
WBC: 8.5 10*3/uL (ref 3.6–11.0)

## 2016-05-06 LAB — TROPONIN I

## 2016-05-06 LAB — LIPASE, BLOOD: Lipase: 89 U/L — ABNORMAL HIGH (ref 11–51)

## 2016-05-06 MED ORDER — TRAMADOL HCL 50 MG PO TABS: 50.0000 mg | ORAL_TABLET | Freq: Four times a day (QID) | ORAL | Status: DC | PRN

## 2016-05-06 NOTE — Discharge Instructions (Signed)
Biliary Colic °Biliary colic is a pain in the upper abdomen. The pain: °· Is usually felt on the right side of the abdomen, but it may also be felt in the center of the abdomen, just below the breastbone (sternum). °· May spread back toward the right shoulder blade. °· May be steady or irregular. °· May be accompanied by nausea and vomiting. °Most of the time, the pain goes away in 1-5 hours. After the most intense pain passes, the abdomen may continue to ache mildly for about 24 hours. °Biliary colic is caused by a blockage in the bile duct. The bile duct is a pathway that carries bile--a liquid that helps to digest fats--from the gallbladder to the small intestine. Biliary colic usually occurs after eating, when the digestive system demands bile. The pain develops when muscle cells contract forcefully to try to move the blockage so that bile can get by. °HOME CARE INSTRUCTIONS °· Take medicines only as directed by your health care provider. °· Drink enough fluid to keep your urine clear or pale yellow. °· Avoid fatty, greasy, and fried foods. These kinds of foods increase your body's demand for bile. °· Avoid any foods that make your pain worse. °· Avoid overeating. °· Avoid having a large meal after fasting. °SEEK MEDICAL CARE IF: °· You develop a fever. °· Your pain gets worse. °· You vomit. °· You develop nausea that prevents you from eating and drinking. °SEEK IMMEDIATE MEDICAL CARE IF: °· You suddenly develop a fever and shaking chills. °· You develop a yellowish discoloration (jaundice) of: °¨ Skin. °¨ Whites of the eyes. °¨ Mucous membranes. °· You have continuous or severe pain that is not relieved with medicines. °· You have nausea and vomiting that is not relieved with medicines. °· You develop dizziness or you faint. °  °This information is not intended to replace advice given to you by your health care provider. Make sure you discuss any questions you have with your health care provider. °  °Document  Released: 04/23/2006 Document Revised: 04/06/2015 Document Reviewed: 09/01/2014 °Elsevier Interactive Patient Education ©2016 [employer] ° °

## 2016-05-06 NOTE — ED Notes (Signed)
Bedside preg poct negative

## 2016-05-06 NOTE — ED Provider Notes (Signed)
Time Seen: Approximately 1530 I have reviewed the triage notes  Chief Complaint: Abdominal Pain   History of Present Illness: Allison Flynn is a 35 y.o. female *who presents with her second episode of pain that started in the epigastric area and radiated toward the right upper quadrant. States she had an episode approximately a week ago and seemed to resolve on its own. She denies any pain straight through to the back. She states the episode returned today at approximately 1:00 and seemed to be much more persistent long-lasting than the previous discomfort that she had. She has had a lot of indigestion and some frequent burping. She states she knows that with her last pregnancy and has continued now postpartum. The patient's last delivery was in April of this year. The patient denies any lower abdominal pain, fever, nausea, vomiting. She states the pain started approximately 4 hours after breakfast.   Past Medical History  Diagnosis Date  . Scoliosis 1995  . Vaginal Pap smear, abnormal     Patient Active Problem List   Diagnosis Date Noted  . Postpartum care following vaginal delivery 03/20/2016  . Idiopathic scoliosis 09/20/2015    Past Surgical History  Procedure Laterality Date  . No past surgeries    . Tubal ligation N/A 03/21/2016    Procedure: POST PARTUM TUBAL LIGATION;  Surgeon: Will Bonnet, MD;  Location: [facility] ORS;  Service: Gynecology;  Laterality: N/A;    Past Surgical History  Procedure Laterality Date  . No past surgeries    . Tubal ligation N/A 03/21/2016    Procedure: POST PARTUM TUBAL LIGATION;  Surgeon: Will Bonnet, MD;  Location: [facility] ORS;  Service: Gynecology;  Laterality: N/A;    Current Outpatient Rx  Name  Route  Sig  Dispense  Refill  . Prenatal Multivit-Min-Fe-FA (PRENATAL VITAMINS) 0.8 MG tablet   Oral   Take 1 tablet by mouth daily.   90 tablet   3   . dibucaine (NUPERCAINAL) 1 % OINT   Rectal   Place 1 application rectally as  needed for hemorrhoids.   15 g   0   . ibuprofen (ADVIL,MOTRIN) 600 MG tablet   Oral   Take 1 tablet (600 mg total) by mouth every 6 (six) hours as needed for mild pain, moderate pain or cramping.   50 tablet   0   . Iron-FA-B Cmp-C-Biot-Probiotic (FUSION PLUS) CAPS   Oral   Take 1 capsule by mouth daily.   30 capsule   1   . oxyCODONE-acetaminophen (PERCOCET/ROXICET) 5-325 MG tablet   Oral   Take 1-2 tablets by mouth every 6 (six) hours as needed for moderate pain or severe pain.   30 tablet   0   . traMADol (ULTRAM) 50 MG tablet   Oral   Take 1 tablet (50 mg total) by mouth every 6 (six) hours as needed.   20 tablet   0     Allergies:  Review of patient's allergies indicates no known allergies.  Family History: Family History  Problem Relation Age of Onset  . Heart disease Maternal Grandmother   . Deep vein thrombosis Maternal Grandmother   . Anemia Sister     Social History: Social History  Substance Use Topics  . Smoking status: Former Research scientist (life sciences)  . Smokeless tobacco: Never Used  . Alcohol Use: No     Review of Systems:   10 point review of systems was performed and was otherwise negative:  Constitutional: No fever  Eyes: No visual disturbances ENT: No sore throat, ear pain Cardiac: No chest pain. No pleuritic pain Respiratory: No shortness of breath, wheezing, or stridor Abdomen: Patient points to the epigastric area and radiates toward her right upper quadrant no nausea, diarrhea, melena or hematochezia. Endocrine: No weight loss, No night sweats Extremities: No peripheral edema, cyanosis Skin: No rashes, easy bruising Neurologic: No focal weakness, trouble with speech or swollowing Urologic: No dysuria, Hematuria, or urinary frequency   Physical Exam:  ED Triage Vitals  Enc Vitals Group     BP 05/06/16 1334 115/74 mmHg     Pulse Rate 05/06/16 1334 71     Resp 05/06/16 1334 20     Temp 05/06/16 1334 97.7 F (36.5 C)     Temp Source 05/06/16  1334 Oral     SpO2 05/06/16 1334 97 %     Weight 05/06/16 1334 180 lb (81.647 kg)     Height 05/06/16 1334 5\' 2"  (1.575 m)     Head Cir --      Peak Flow --      Pain Score 05/06/16 1336 5     Pain Loc --      Pain Edu? --      Excl. in [place]? --     General: Awake , Alert , and Oriented times 3; GCS 15 Head: Normal cephalic , atraumatic Eyes: Pupils equal , round, reactive to light Nose/Throat: No nasal drainage, patent upper airway without erythema or exudate.  Neck: Supple, Full range of motion, No anterior adenopathy or palpable thyroid masses Lungs: Clear to ascultation without wheezes , rhonchi, or rales Heart: Regular rate, regular rhythm without murmurs , gallops , or rubs Abdomen: Mild tenderness over the epigastric area with some radiation toward the right upper quadrant without peritoneal signs. Bowel sounds are positive and symmetric in all 4 quadrants.      Extremities: 2 plus symmetric pulses. No edema, clubbing or cyanosis Neurologic: normal ambulation, Motor symmetric without deficits, sensory intact Skin: warm, dry, no rashes   Labs:   All laboratory work was reviewed including any pertinent negatives or positives listed below:  Labs Reviewed  LIPASE, BLOOD - Abnormal; Notable for the following:    Lipase 89 (*)    All other components within normal limits  COMPREHENSIVE METABOLIC PANEL - Abnormal; Notable for the following:    Glucose, Bld 108 (*)    All other components within normal limits  URINALYSIS COMPLETEWITH MICROSCOPIC ([facility] ONLY) - Abnormal; Notable for the following:    Color, Urine YELLOW (*)    APPearance HAZY (*)    Leukocytes, UA 2+ (*)    Bacteria, UA RARE (*)    Squamous Epithelial / LPF 0-5 (*)    All other components within normal limits  CBC  TROPONIN I  POC URINE PREG, ED  Lipase is slightly elevated  EKG: ED ECG REPORT I, Daymon Larsen, the attending physician, personally viewed and interpreted this ECG.  Date: 05/06/2016 EKG  Time: 1347 Rate: 64 Rhythm: normal sinus rhythm QRS Axis: normal Intervals: normal ST/T Wave abnormalities: normal Conduction Disturbances: none Narrative Interpretation: unremarkable Normal EKG   Radiology: *   Narrative:   CLINICAL DATA: Right upper quadrant pain for 1 day  EXAM: US ABDOMEN LIMITED - RIGHT UPPER QUADRANT  COMPARISON: None.  FINDINGS: Gallbladder:  Numerous layering gallstones, 5 mm or less in size. No wall thickening. Negative sonographic Murphy's.  Common bile duct:  Diameter: Normal caliber, 3 mm.  Liver:  No focal lesion identified. Within normal limits in parenchymal echogenicity.  IMPRESSION: Cholelithiasis. No sonographic evidence of acute cholecystitis.   Electronically Signed By: Rolm Baptise M.D.        I personally reviewed the radiologic studies   P ED Course:  Patient's stay here was uneventful and the pain seemed to resolve on its own. I felt her differential was down to biliary colic versus gastritis. Her lipase is slightly elevated but I don't think her clinical presentation and numbers are consistent with pancreatitis especially with the resolution of her discomfort. Patient was referred to [facility] for further workup for biliary colic. She was also advised to get a primary doctor for further follow-up especially for heartburn and possible gastritis. Patient was advised take over-the-counter antacid medication Tylenol for pain was prescribed Ultram for breakthrough pain.    Assessment:  Acute unspecified epigastric and right upper quadrant pain   Final Clinical Impression:   Final diagnoses:  Right upper quadrant pain     Plan:  Outpatient Patient was advised to return immediately if condition worsens. Patient was advised to follow up with their primary care physician or other specialized physicians involved in their outpatient care. The patient and/or family member/power of attorney had  laboratory results reviewed at the bedside. All questions and concerns were addressed and appropriate discharge instructions were distributed by the nursing staff.            Daymon Larsen, MD 05/06/16 [phone]

## 2016-05-06 NOTE — ED Notes (Signed)
Pt transported to ultrasound.

## 2016-05-06 NOTE — ED Notes (Signed)
Epigastric pain began approx 30 minutes ago, states had similiar episode one week ago that lasted about 30 min but was not seen.

## 2016-05-16 ENCOUNTER — Encounter: Payer: Self-pay | Admitting: General Surgery

## 2016-05-16 ENCOUNTER — Ambulatory Visit (INDEPENDENT_AMBULATORY_CARE_PROVIDER_SITE_OTHER): Payer: Medicaid Other | Admitting: General Surgery

## 2016-05-16 VITALS — BP 143/88 | HR 76 | Temp 96.2°F | Ht 62.0 in | Wt 194.0 lb

## 2016-05-16 DIAGNOSIS — K8020 Calculus of gallbladder without cholecystitis without obstruction: Secondary | ICD-10-CM | POA: Insufficient documentation

## 2016-05-16 NOTE — Progress Notes (Signed)
Patient ID: Allison Flynn, female   DOB: Jan 03, 1981, 35 y.o.   MRN: [id number]  CC: RUQ PAIN  HPI Allison Flynn is a 35 y.o. female who presents to clinic today for evaluation of right upper quadrant abdominal pain. Patient states that over the last 6 weeks she's had 2 separate attacks of right upper quadrant abdominal pain. Both these occurred after eating fatty meals. She was in the emergency department for this proximally 2 weeks ago because the pain was the worst ever been. Patient has a significant history of being 2 months postpartum. She is still breast-feeding. She states he continues to have a dull right upper quadrant ache that is definitely worsened after eating fatty meals. She denies any fevers, chills, nausea, vomiting, diarrhea, constipation. She is otherwise in her usual state of good health.  HPI  Past Medical History  Diagnosis Date  . Scoliosis 1995  . Vaginal Pap smear, abnormal     Past Surgical History  Procedure Laterality Date  . No past surgeries    . Tubal ligation N/A 03/21/2016    Procedure: POST PARTUM TUBAL LIGATION;  Surgeon: Will Bonnet, MD;  Location: [facility] ORS;  Service: Gynecology;  Laterality: N/A;    Family History  Problem Relation Age of Onset  . Heart disease Maternal Grandmother   . Deep vein thrombosis Maternal Grandmother   . Anemia Sister     Social History Social History  Substance Use Topics  . Smoking status: Former Research scientist (life sciences)  . Smokeless tobacco: Never Used  . Alcohol Use: No    No Known Allergies  Current Outpatient Prescriptions  Medication Sig Dispense Refill  . Prenatal Multivit-Min-Fe-FA (PRENATAL VITAMINS) 0.8 MG tablet Take 1 tablet by mouth daily. 90 tablet 3  . dibucaine (NUPERCAINAL) 1 % OINT Place 1 application rectally as needed for hemorrhoids. (Patient not taking: Reported on 05/16/2016) 15 g 0  . ibuprofen (ADVIL,MOTRIN) 600 MG tablet Take 1 tablet (600 mg total) by mouth every 6 (six) hours as  needed for mild pain, moderate pain or cramping. (Patient not taking: Reported on 05/16/2016) 50 tablet 0  . Iron-FA-B Cmp-C-Biot-Probiotic (FUSION PLUS) CAPS Take 1 capsule by mouth daily. (Patient not taking: Reported on 05/16/2016) 30 capsule 1  . oxyCODONE-acetaminophen (PERCOCET/ROXICET) 5-325 MG tablet Take 1-2 tablets by mouth every 6 (six) hours as needed for moderate pain or severe pain. (Patient not taking: Reported on 05/16/2016) 30 tablet 0  . traMADol (ULTRAM) 50 MG tablet Take 1 tablet (50 mg total) by mouth every 6 (six) hours as needed. (Patient not taking: Reported on 05/16/2016) 20 tablet 0   No current facility-administered medications for this visit.     Review of Systems A Multi-point review of systems was asked and was negative except for the findings documented in the history of present illness  Physical Exam Blood pressure 143/88, pulse 76, temperature 96.2 F (35.7 C), temperature source Oral, height 5\' 2"  (1.575 m), weight 87.998 kg (194 lb), unknown if currently breastfeeding. CONSTITUTIONAL: No acute distress. EYES: Pupils are equal, round, and reactive to light, Sclera are non-icteric. EARS, NOSE, MOUTH AND THROAT: The oropharynx is clear. The oral mucosa is pink and moist. Hearing is intact to voice. LYMPH NODES:  Lymph nodes in the neck are normal. RESPIRATORY:  Lungs are clear. There is normal respiratory effort, with equal breath sounds bilaterally, and without pathologic use of accessory muscles. CARDIOVASCULAR: Heart is regular without murmurs, gallops, or rubs. GI: The abdomen is soft, nontender, and  nondistended. There are no palpable masses. There is no hepatosplenomegaly. There are normal bowel sounds in all quadrants. Well-healed laparoscopic tubal ligation incision in the infraumbilical region GU: Rectal deferred.   MUSCULOSKELETAL: Normal muscle strength and tone. No cyanosis or edema.   SKIN: Turgor is good and there are no pathologic skin lesions or  ulcers. NEUROLOGIC: Motor and sensation is grossly normal. Cranial nerves are grossly intact. PSYCH:  Oriented to person, place and time. Affect is normal.  Data Reviewed Images and labs reviewed. Ultrasound of the abdomen does show evidence of cholelithiasis without cholecystitis. There is no gallbladder wall thickening, ductal dilatation, pericholecystic fluid. Her labs are primarily normal limits. I have personally reviewed the patient's imaging, laboratory findings and medical records.    Assessment    Biliary colic    Plan    35 year old female with a history and physical consistent with biliary colic. The procedure lap scopic cholecystectomy was discussed in detail .I discussed the procedure in detail.  The patient was given Neurosurgeon.  We discussed the risks and benefits of a laparoscopic cholecystectomy and possible cholangiogram including, but not limited to bleeding, infection, injury to surrounding structures such as the intestine or liver, bile leak, retained gallstones, need to convert to an open procedure, prolonged diarrhea, blood clots such as  DVT, common bile duct injury, anesthesia risks, and possible need for additional procedures.  The likelihood of improvement in symptoms and return to the patient's normal status is good. We discussed the typical post-operative recovery course. Plan for laparoscopic cholecystectomy on Monday, June 26.      Time spent with the patient was 55 minutes, with more than 50% of the time spent in face-to-face education, counseling and care coordination.     Clayburn Pert, MD FACS General Surgeon 05/16/2016, 1:35 PM

## 2016-05-16 NOTE — Patient Instructions (Signed)
You have requested to have your Gallbladder removed. We will arrange this to be done on 05-29-16 at [place] will be off from work for approximately 1-2 weeks depending on your recovery.   Please avoid greasy and fried foods if at all possible prior to your scheduled surgery to decrease symptoms until then.  Please see the [facility]) pre-care form you have been given today.  If you have any questions or concerns please call our office.

## 2016-05-17 ENCOUNTER — Telehealth: Payer: Self-pay | Admitting: General Surgery

## 2016-05-17 NOTE — Telephone Encounter (Signed)
Pt advised of pre op date/time and sx date. Sx: 05/29/16 with Dr Gabrielle Dare cholecystectomy.  Pre op: 05/22/16 between 9-1:00pm--phone.   Patient made aware to call [phone], between 1-3:00pm the day before surgery, to find out what time to arrive.

## 2016-05-22 ENCOUNTER — Encounter: Payer: Self-pay | Admitting: *Deleted

## 2016-05-22 ENCOUNTER — Other Ambulatory Visit: Payer: Medicaid Other

## 2016-05-22 NOTE — Patient Instructions (Signed)
  Your procedure is scheduled on: 05/29/16 Report to Day Surgery. [facility]LO[facility] To find out your arrival time please call [phone] between 1PM - 3PM on 05/26/16.  Remember: Instructions that are not followed completely may result in serious medical risk, up to and including death, or upon the discretion of your surgeon and anesthesiologist your surgery may need to be rescheduled.    __X__ 1. Do not eat food or drink liquids after midnight. No gum chewing or hard candies.     _X___ 2. No Alcohol for 24 hours before or after surgery.   __X__ 3. Do Not Smoke For 24 Hours Prior to Your Surgery.   ____ 4. Bring all medications with you on the day of surgery if instructed.    _X___ 5. Notify your doctor if there is any change in your medical condition     (cold, fever, infections).       Do not wear jewelry, make-up, hairpins, clips or nail polish.  Do not wear lotions, powders, or perfumes. You may wear deodorant.  Do not shave 48 hours prior to surgery. Men may shave face and neck.  Do not bring valuables to the hospital.    [facility] is not responsible for any belongings or valuables.               Contacts, dentures or bridgework may not be worn into surgery.  Leave your suitcase in the car. After surgery it may be brought to your room.  For patients admitted to the hospital, discharge time is determined by your                treatment team.   Patients discharged the day of surgery will not be allowed to drive home.   Please read over the following fact sheets that you were given:   Surgical Site Infection Prevention /ADVANCE DIRECTIVES BOOKLET   ____ Take these medicines the morning of surgery with A SIP OF WATER:    1.NONE  2.   3.   4.  5.  6.  ____ Fleet Enema (as directed)   __X__ Use CHG Soap as directed  ____ Use inhalers on the day of surgery  ____ Stop metformin 2 days prior to surgery    ____ Take 1/2 of usual insulin dose the night before  surgery and none on the morning of surgery.   ____ Stop Coumadin/Plavix/aspirin on   ____ Stop Anti-inflammatories on    ____ Stop supplements until after surgery.    ____ Bring C-Pap to the hospital.    ASK ANESTHESIA IF NEED TO PUMP AND DISCARD MILK FOR A TIME AFTER SURGERY

## 2016-05-29 ENCOUNTER — Encounter: Payer: Self-pay | Admitting: *Deleted

## 2016-05-29 ENCOUNTER — Encounter
Admission: RE | Disposition: A | Discharge status: Home / Self Care | Payer: Self-pay | Source: Ambulatory Visit | Attending: General Surgery

## 2016-05-29 ENCOUNTER — Ambulatory Visit: Payer: Medicaid Other | Admitting: Anesthesiology

## 2016-05-29 ENCOUNTER — Ambulatory Visit
Admission: RE | Admit: 2016-05-29 | Discharge: 2016-05-29 | Disposition: A | Discharge status: Home / Self Care | Payer: Medicaid Other | Source: Ambulatory Visit | Attending: General Surgery | Admitting: General Surgery

## 2016-05-29 DIAGNOSIS — K219 Gastro-esophageal reflux disease without esophagitis: Secondary | ICD-10-CM | POA: Diagnosis not present

## 2016-05-29 DIAGNOSIS — Z87891 Personal history of nicotine dependence: Secondary | ICD-10-CM | POA: Diagnosis not present

## 2016-05-29 DIAGNOSIS — K828 Other specified diseases of gallbladder: Secondary | ICD-10-CM | POA: Diagnosis not present

## 2016-05-29 DIAGNOSIS — K8010 Calculus of gallbladder with chronic cholecystitis without obstruction: Secondary | ICD-10-CM | POA: Insufficient documentation

## 2016-05-29 DIAGNOSIS — M419 Scoliosis, unspecified: Secondary | ICD-10-CM | POA: Insufficient documentation

## 2016-05-29 DIAGNOSIS — K8050 Calculus of bile duct without cholangitis or cholecystitis without obstruction: Secondary | ICD-10-CM

## 2016-05-29 HISTORY — DX: Gastro-esophageal reflux disease without esophagitis: K21.9

## 2016-05-29 HISTORY — PX: CHOLECYSTECTOMY: SHX55

## 2016-05-29 LAB — POCT PREGNANCY, URINE: Preg Test, Ur: NEGATIVE

## 2016-05-29 SURGERY — LAPAROSCOPIC CHOLECYSTECTOMY
Anesthesia: General | Wound class: Clean Contaminated

## 2016-05-29 MED ORDER — PROMETHAZINE HCL 25 MG/ML IJ SOLN
INTRAMUSCULAR | Status: AC
  Filled 2016-05-29: qty 1

## 2016-05-29 MED ORDER — LIDOCAINE HCL (CARDIAC) 20 MG/ML IV SOLN
INTRAVENOUS | Status: DC | PRN
  Administered 2016-05-29: 100 mg via INTRAVENOUS

## 2016-05-29 MED ORDER — SODIUM CHLORIDE 0.9 % IJ SOLN
INTRAMUSCULAR | Status: AC
  Filled 2016-05-29: qty 10

## 2016-05-29 MED ORDER — LIDOCAINE HCL (PF) 1 % IJ SOLN
INTRAMUSCULAR | Status: AC
  Filled 2016-05-29: qty 30

## 2016-05-29 MED ORDER — DEXAMETHASONE SODIUM PHOSPHATE 10 MG/ML IJ SOLN
INTRAMUSCULAR | Status: DC | PRN
  Administered 2016-05-29: 4 mg via INTRAVENOUS

## 2016-05-29 MED ORDER — DEXTROSE 5 % IV SOLN
1.0000 g | INTRAVENOUS | Status: AC
  Administered 2016-05-29: 1 g via INTRAVENOUS
  Filled 2016-05-29: qty 1

## 2016-05-29 MED ORDER — OXYCODONE HCL 5 MG/5ML PO SOLN: 5.0000 mg | Freq: Once | ORAL | Status: DC | PRN

## 2016-05-29 MED ORDER — FENTANYL CITRATE (PF) 100 MCG/2ML IJ SOLN
25.0000 ug | INTRAMUSCULAR | Status: AC | PRN
  Administered 2016-05-29 (×6): 25 ug via INTRAVENOUS

## 2016-05-29 MED ORDER — HYDROCODONE-ACETAMINOPHEN 5-325 MG PO TABS: 1.0000 | ORAL_TABLET | Freq: Four times a day (QID) | ORAL | Status: DC | PRN

## 2016-05-29 MED ORDER — LACTATED RINGERS IV SOLN
INTRAVENOUS | Status: DC
  Administered 2016-05-29 (×3): via INTRAVENOUS

## 2016-05-29 MED ORDER — FAMOTIDINE 20 MG PO TABS
ORAL_TABLET | ORAL | Status: AC
  Filled 2016-05-29: qty 1

## 2016-05-29 MED ORDER — FENTANYL CITRATE (PF) 100 MCG/2ML IJ SOLN
INTRAMUSCULAR | Status: AC
  Filled 2016-05-29: qty 2

## 2016-05-29 MED ORDER — FAMOTIDINE 20 MG PO TABS
20.0000 mg | ORAL_TABLET | Freq: Once | ORAL | Status: AC
  Administered 2016-05-29: 20 mg via ORAL

## 2016-05-29 MED ORDER — ONDANSETRON HCL 4 MG/2ML IJ SOLN
4.0000 mg | Freq: Once | INTRAMUSCULAR | Status: AC | PRN
  Administered 2016-05-29: 4 mg via INTRAVENOUS

## 2016-05-29 MED ORDER — MIDAZOLAM HCL 2 MG/2ML IJ SOLN
INTRAMUSCULAR | Status: DC | PRN
  Administered 2016-05-29: 2 mg via INTRAVENOUS

## 2016-05-29 MED ORDER — BUPIVACAINE HCL (PF) 0.5 % IJ SOLN
INTRAMUSCULAR | Status: AC
  Filled 2016-05-29: qty 30

## 2016-05-29 MED ORDER — PROPOFOL 10 MG/ML IV BOLUS
INTRAVENOUS | Status: DC | PRN
  Administered 2016-05-29: 160 mg via INTRAVENOUS

## 2016-05-29 MED ORDER — CHLORHEXIDINE GLUCONATE CLOTH 2 % EX PADS: 6.0000 | MEDICATED_PAD | Freq: Once | CUTANEOUS | Status: DC

## 2016-05-29 MED ORDER — SUGAMMADEX SODIUM 200 MG/2ML IV SOLN
INTRAVENOUS | Status: DC | PRN
  Administered 2016-05-29: 176 mg via INTRAVENOUS

## 2016-05-29 MED ORDER — FENTANYL CITRATE (PF) 100 MCG/2ML IJ SOLN
INTRAMUSCULAR | Status: DC | PRN
  Administered 2016-05-29: 100 ug via INTRAVENOUS
  Administered 2016-05-29: 50 ug via INTRAVENOUS

## 2016-05-29 MED ORDER — LIDOCAINE HCL 1 % IJ SOLN
INTRAMUSCULAR | Status: DC | PRN
  Administered 2016-05-29: 17 mL

## 2016-05-29 MED ORDER — ONDANSETRON HCL 4 MG/2ML IJ SOLN
INTRAMUSCULAR | Status: DC | PRN
  Administered 2016-05-29: 4 mg via INTRAVENOUS

## 2016-05-29 MED ORDER — ONDANSETRON HCL 4 MG/2ML IJ SOLN
INTRAMUSCULAR | Status: AC
  Filled 2016-05-29: qty 2

## 2016-05-29 MED ORDER — PROMETHAZINE HCL 25 MG/ML IJ SOLN
6.2500 mg | INTRAMUSCULAR | Status: DC | PRN
  Administered 2016-05-29: 12.5 mg via INTRAVENOUS

## 2016-05-29 MED ORDER — OXYCODONE HCL 5 MG PO TABS: 5.0000 mg | ORAL_TABLET | Freq: Once | ORAL | Status: DC | PRN

## 2016-05-29 MED ORDER — ROCURONIUM BROMIDE 100 MG/10ML IV SOLN
INTRAVENOUS | Status: DC | PRN
  Administered 2016-05-29: 5 mg via INTRAVENOUS
  Administered 2016-05-29: 40 mg via INTRAVENOUS

## 2016-05-29 SURGICAL SUPPLY — 45 items
APPLIER CLIP ROT 10 11.4 M/L (STAPLE) ×4
BAG COUNTER SPONGE EZ (MISCELLANEOUS) IMPLANT
BLADE SURG SZ11 CARB STEEL (BLADE) ×4 IMPLANT
BULB RESERV EVAC DRAIN JP 100C (MISCELLANEOUS) IMPLANT
CANISTER SUCT 1200ML W/VALVE (MISCELLANEOUS) ×4 IMPLANT
CATH CHOLANG 76X19 KUMAR (CATHETERS) IMPLANT
CHLORAPREP W/TINT 26ML (MISCELLANEOUS) ×4 IMPLANT
CLIP APPLIE ROT 10 11.4 M/L (STAPLE) ×2 IMPLANT
CLOSURE WOUND 1/2 X4 (GAUZE/BANDAGES/DRESSINGS)
CONRAY 60ML FOR OR (MISCELLANEOUS) IMPLANT
COUNTER SPONGE BAG EZ (MISCELLANEOUS)
DISSECTOR KITTNER STICK (MISCELLANEOUS) IMPLANT
DISSECTORS/KITTNER STICK (MISCELLANEOUS)
DRAIN CHANNEL JP 19F (MISCELLANEOUS) IMPLANT
DRAPE SHEET LG 3/4 BI-LAMINATE (DRAPES) ×4 IMPLANT
ELECT REM PT RETURN 9FT ADLT (ELECTROSURGICAL) ×4
ELECTRODE REM PT RTRN 9FT ADLT (ELECTROSURGICAL) ×2 IMPLANT
GAUZE SPONGE 4X4 12PLY STRL (GAUZE/BANDAGES/DRESSINGS) ×4 IMPLANT
GLOVE BIO SURGEON STRL SZ7.5 (GLOVE) ×16 IMPLANT
GLOVE INDICATOR 8.0 STRL GRN (GLOVE) ×12 IMPLANT
GOWN STRL REUS W/ TWL LRG LVL3 (GOWN DISPOSABLE) ×6 IMPLANT
GOWN STRL REUS W/TWL LRG LVL3 (GOWN DISPOSABLE) ×6
IRRIGATION STRYKERFLOW (MISCELLANEOUS) ×2 IMPLANT
IRRIGATOR STRYKERFLOW (MISCELLANEOUS) ×4
IV NS 1000ML (IV SOLUTION)
IV NS 1000ML BAXH (IV SOLUTION) IMPLANT
L-HOOK LAP DISP 36CM (ELECTROSURGICAL) ×4
LABEL OR SOLS (LABEL) ×4 IMPLANT
LHOOK LAP DISP 36CM (ELECTROSURGICAL) ×2 IMPLANT
NEEDLE HYPO 25X1 1.5 SAFETY (NEEDLE) ×4 IMPLANT
NEEDLE VERESS 14GA 120MM (NEEDLE) ×4 IMPLANT
NS IRRIG 500ML POUR BTL (IV SOLUTION) ×4 IMPLANT
PACK LAP CHOLECYSTECTOMY (MISCELLANEOUS) ×4 IMPLANT
PENCIL ELECTRO HAND CTR (MISCELLANEOUS) ×4 IMPLANT
POUCH ENDO CATCH 10MM SPEC (MISCELLANEOUS) ×4 IMPLANT
SCISSORS METZENBAUM CVD 33 (INSTRUMENTS) ×4 IMPLANT
SLEEVE ENDOPATH XCEL 5M (ENDOMECHANICALS) ×8 IMPLANT
STRIP CLOSURE SKIN 1/2X4 (GAUZE/BANDAGES/DRESSINGS) IMPLANT
SUT MNCRL 4-0 (SUTURE) ×2
SUT MNCRL 4-0 27XMFL (SUTURE) ×2
SUTURE MNCRL 4-0 27XMF (SUTURE) ×2 IMPLANT
SWABSTK COMLB BENZOIN TINCTURE (MISCELLANEOUS) ×4 IMPLANT
TROCAR XCEL 12X100 BLDLESS (ENDOMECHANICALS) ×4 IMPLANT
TROCAR XCEL NON-BLD 5MMX100MML (ENDOMECHANICALS) ×4 IMPLANT
TUBING INSUFFLATOR HI FLOW (MISCELLANEOUS) ×4 IMPLANT

## 2016-05-29 NOTE — Anesthesia Postprocedure Evaluation (Signed)
Anesthesia Post Note  Patient: Allison Flynn  Procedure(s) Performed: Procedure(s): LAPAROSCOPIC CHOLECYSTECTOMY  Patient location during evaluation: PACU Anesthesia Type: General Level of consciousness: awake and alert Pain management: pain level controlled Vital Signs Assessment: post-procedure vital signs reviewed and stable Respiratory status: spontaneous breathing, nonlabored ventilation, respiratory function stable and patient connected to nasal cannula oxygen Cardiovascular status: blood pressure returned to baseline and stable Postop Assessment: no signs of nausea or vomiting Anesthetic complications: no    Last Vitals:  Filed Vitals:   05/29/16 0948 05/29/16 1006  BP: 114/54 99/59  Pulse: 72 64  Temp: 36.6 C   Resp: 14 12    Last Pain:  Filed Vitals:   05/29/16 1007  PainSc: 1                  Joseph K Piscitello

## 2016-05-29 NOTE — Interval H&P Note (Signed)
History and Physical Interval Note:  05/29/2016 6:53 AM  Allison Flynn  has presented today for surgery, with the diagnosis of CALCULUS OF GALLBLADDER  The various methods of treatment have been discussed with the patient and family. After consideration of risks, benefits and other options for treatment, the patient has consented to  Procedure(s): LAPAROSCOPIC CHOLECYSTECTOMY WITH INTRAOPERATIVE CHOLANGIOGRAM (N/A) as a surgical intervention .  The patient's history has been reviewed, patient examined, no change in status, stable for surgery.  I have reviewed the patient's chart and labs.  Questions were answered to the patient's satisfaction.     Clayburn Pert

## 2016-05-29 NOTE — Discharge Instructions (Signed)
Laparoscopic Cholecystectomy, Care After Refer to this sheet in the next few weeks. These instructions provide you with information about caring for yourself after your procedure. Your health care provider may also give you more specific instructions. Your treatment has been planned according to current medical practices, but problems sometimes occur. Call your health care provider if you have any problems or questions after your procedure. WHAT TO EXPECT AFTER THE PROCEDURE After your procedure, it is common to have:  Pain at your incision sites. You will be given pain medicines to control your pain.  Mild nausea or vomiting. This should improve after the first 24 hours.  Bloating and possible shoulder pain from the gas that was used during the procedure. This will improve after the first 24 hours. HOME CARE INSTRUCTIONS Incision Care  Follow instructions from your health care provider about how to take care of your incisions. Make sure you:  Wash your hands with soap and water before you change your bandage (dressing). If soap and water are not available, use hand sanitizer.  Change your dressing as told by your health care provider. (May remove covering gauze in 48 hours, replace as needed until all drainage stops)  Leave stitches (sutures), skin glue, or adhesive strips in place. These skin closures may need to be in place for 2 weeks or longer. If adhesive strip edges start to loosen and curl up, you may trim the loose edges. Do not remove adhesive strips completely unless your health care provider tells you to do that.  Do not take baths, swim, or use a hot tub until your health care provider approves. Ask your health care provider if you can take showers. You may only be allowed to take sponge baths for bathing. (OK to shower in 48 hours) General Instructions  Take over-the-counter and prescription medicines only as told by your health care provider.  Do not drive or operate heavy  machinery while taking prescription pain medicine.  Return to your normal diet as told by your health care provider.  Do not lift anything that is heavier than 10 lb (4.5 kg).  Do not play contact sports for one week or until your health care provider approves. SEEK MEDICAL CARE IF:   You have redness, swelling, or pain at the site of your incision.  You have fluid, blood, or pus coming from your incision.  You notice a bad smell coming from your incision area.  Your surgical incisions break open.  You have a fever. SEEK IMMEDIATE MEDICAL CARE IF:  You develop a rash.  You have difficulty breathing.  You have chest pain.  You have increasing pain in your shoulders (shoulder strap areas).  You faint or have dizzy episodes while you are standing.  You have severe pain in your abdomen.  You have nausea or vomiting that lasts for more than one day.   This information is not intended to replace advice given to you by your health care provider. Make sure you discuss any questions you have with your health care provider.   Document Released: 11/20/2005 Document Revised: 08/11/2015 Document Reviewed: 07/02/2013 Elsevier Interactive Patient Education 2016 [place] Anesthesia, Adult General anesthesia is a sleep-like state of non-feeling produced by medicines (anesthetics). General anesthesia prevents you from being alert and feeling pain during a medical procedure. Your caregiver may recommend general anesthesia if your procedure:  Is long.  Is painful or uncomfortable.  Would be frightening to see or hear.  Requires you to be still.  Affects your breathing.  Causes significant blood loss. LET YOUR CAREGIVER KNOW ABOUT:  Allergies to food or medicine.  Medicines taken, including vitamins, herbs, eyedrops, over-the-counter medicines, and creams.  Use of steroids (by mouth or creams).  Previous problems with anesthetics or numbing medicines,  including problems experienced by relatives.  History of bleeding problems or blood clots.  Previous surgeries and types of anesthetics received.  Possibility of pregnancy, if this applies.  Use of cigarettes, alcohol, or illegal drugs.  Any health condition(s), especially diabetes, sleep apnea, and high blood pressure. RISKS AND COMPLICATIONS General anesthesia rarely causes complications. However, if complications do occur, they can be life threatening. Complications include:  A lung infection.  A stroke.  A heart attack.  Waking up during the procedure. When this occurs, the patient may be unable to move and communicate that he or she is awake. The patient may feel severe pain. Older adults and adults with serious medical problems are more likely to have complications than adults who are young and healthy. Some complications can be prevented by answering all of your caregiver's questions thoroughly and by following all pre-procedure instructions. It is important to tell your caregiver if any of the pre-procedure instructions, especially those related to diet, were not followed. Any food or liquid in the stomach can cause problems when you are under general anesthesia. BEFORE THE PROCEDURE  Ask your caregiver if you will have to spend the night at the hospital. If you will not have to spend the night, arrange to have an adult drive you and stay with you for 24 hours.  Follow your caregiver's instructions if you are taking dietary supplements or medicines. Your caregiver may tell you to stop taking them or to reduce your dosage.  Do not smoke for as long as possible before your procedure. If possible, stop smoking 3-6 weeks before the procedure.  Do not take new dietary supplements or medicines within 1 week of your procedure unless your caregiver approves them.  Do not eat within 8 hours of your procedure or as directed by your caregiver. Drink only clear liquids, such as water,  black coffee (without milk or cream), and fruit juices (without pulp).  Do not drink within 3 hours of your procedure or as directed by your caregiver.  You may brush your teeth on the morning of the procedure, but make sure to spit out the toothpaste and water when finished. PROCEDURE  You will receive anesthetics through a mask, through an intravenous (IV) access tube, or through both. A doctor who specializes in anesthesia (anesthesiologist) or a nurse who specializes in anesthesia (nurse anesthetist) or both will stay with you throughout the procedure to make sure you remain unconscious. He or she will also watch your blood pressure, pulse, and oxygen levels to make sure that the anesthetics do not cause any problems. Once you are asleep, a breathing tube or mask may be used to help you breathe. AFTER THE PROCEDURE You will wake up after the procedure is complete. You may be in the room where the procedure was performed or in a recovery area. You may have a sore throat if a breathing tube was used. You may also feel:  Dizzy.  Weak.  Drowsy.  Confused.  Nauseous.  Cold. These are all normal responses and can be expected to last for up to 24 hours after the procedure is complete. A caregiver will tell you when you are ready to go  home. This will usually be when you are fully awake and in stable condition.   This information is not intended to replace advice given to you by your health care provider. Make sure you discuss any questions you have with your health care provider.   Document Released: 02/27/2008 Document Revised: 12/11/2014 Document Reviewed: 03/20/2012 Elsevier Interactive Patient Education [employer].

## 2016-05-29 NOTE — Brief Op Note (Signed)
05/29/2016  8:30 AM  PATIENT:  Magdalen Spatz  35 y.o. female  PRE-OPERATIVE DIAGNOSIS:  CALCULUS OF GALLBLADDER  POST-OPERATIVE DIAGNOSIS:  CALCULUS OF GALLBLADDER  PROCEDURE:  Procedure(s): LAPAROSCOPIC CHOLECYSTECTOMY  SURGEON:  Surgeon(s) and Role:    * Clayburn Pert, MD - Primary  PHYSICIAN ASSISTANT:   ASSISTANTS: none   ANESTHESIA:   general  EBL:  Total I/O In: 700 [I.V.:700] Out: 5 [Blood:5]  BLOOD ADMINISTERED:none  DRAINS: none   LOCAL MEDICATIONS USED:  MARCAINE   , XYLOCAINE  and Amount: 17 ml  SPECIMEN:  Source of Specimen:  gallbladder  DISPOSITION OF SPECIMEN:  PATHOLOGY  COUNTS:  YES  TOURNIQUET:  * No tourniquets in log *  DICTATION: .Dragon Dictation  PLAN OF CARE: Discharge to home after PACU  PATIENT DISPOSITION:  PACU - hemodynamically stable.   Delay start of Pharmacological VTE agent (>24hrs) due to surgical blood loss or risk of bleeding: not applicable

## 2016-05-29 NOTE — Op Note (Signed)
Laparoscopic Cholecystectomy  Pre-operative Diagnosis: Biliary Colic   Post-operative Diagnosis: Same  Procedure: Laparoscopic Cholecystectomy  Surgeon: Juanda Crumble T. Adonis Huguenin, MD FACS  Anesthesia: Gen. with endotracheal tube  Assistant:None  Procedure Details  The patient was seen again in the Holding Room. The benefits, complications, treatment options, and expected outcomes were discussed with the patient. The risks of bleeding, infection, recurrence of symptoms, failure to resolve symptoms, bile duct damage, bile duct leak, retained common bile duct stone, bowel injury, any of which could require further surgery and/or ERCP, stent, or papillotomy were reviewed with the patient. The likelihood of improving the patient's symptoms with return to their baseline status is good.  The patient and/or family concurred with the proposed plan, giving informed consent.  The patient was taken to Operating Room, identified as Magdalen Spatz and the procedure verified as Laparoscopic Cholecystectomy.  A Time Out was held and the above information confirmed.  Prior to the induction of general anesthesia, antibiotic prophylaxis was administered. VTE prophylaxis was in place. General endotracheal anesthesia was then administered and tolerated well. After the induction, the abdomen was prepped with Chloraprep and draped in the sterile fashion. The patient was positioned in the supine position.  Local anesthetic  was injected into the skin in the right upper quadrant, 2 finger breaths below the costal margin in the mid-clavicular line, an incision was made. The Veress needle was placed. Pneumoperitoneum was then created with CO2 and tolerated well without any adverse changes in the patient's vital signs. A 44mm port was placed in the incision and the abdominal cavity was explored.  Two 5-mm ports were placed, one in the peri-umbilical region and one in the right upper quadrant and a 12 mm epigastric port was  placed all under direct vision. All skin incisions  were infiltrated with a local anesthetic agent before making the incision and placing the trocars.   The patient was positioned  in reverse Trendelenburg, tilted slightly to the patient's left.  The gallbladder was identified, the fundus grasped and retracted cephalad. Adhesions were lysed bluntly. The adhesions were noted to be very vascular and cautery was used where it was safe to be used. The infundibulum was grasped and retracted laterally, exposing the peritoneum overlying the triangle of Calot. This was then divided and exposed in a blunt fashion. A critical view of the cystic duct and cystic artery was obtained.  The cystic duct was clearly identified and bluntly dissected.   The artery and duct were easily identified and clipped at the same time with three clips each. They were then cut between the clips. The gallbladder was taken from the gallbladder fossa in a retrograde fashion with the electrocautery. A small entry into the gallbladder was made with the cautery on the back wall causing some spillage of bile. The gallbladder was removed and placed in an Endocatch bag. The liver bed was irrigated and inspected. Hemostasis was achieved with the electrocautery. Copious irrigation was utilized and was repeatedly aspirated until clear.  The gallbladder and Endocatch sac were then removed through the epigastric port site.   Inspection of the right upper quadrant was performed and the area was copiously irrigated until our irrigation returned clear. No bleeding, bile duct injury or leak, or bowel injury was noted. Pneumoperitoneum was released.  4-0 subcuticular Monocryl was used to close the skin at all of our incision sites. Steristrips and Mastisol and sterile dressings were  applied.  The patient was then extubated and brought to the recovery  room in stable condition. Sponge, lap, and needle counts were correct at closure and at the conclusion of the  case.   Findings: Evidence of previous Cholecystitis   Estimated Blood Loss: 49mLs         Drains: None         Specimens: Gallbladder           Complications: none               Glenyce Randle T. Adonis Huguenin, MD, FACS

## 2016-05-29 NOTE — Progress Notes (Signed)
Sleeping quietly on the recliner chair.

## 2016-05-29 NOTE — Anesthesia Preprocedure Evaluation (Signed)
Anesthesia Evaluation  Patient identified by MRN, date of birth, ID band Patient awake    Reviewed: Allergy & Precautions, H&P , NPO status , Patient's Chart, lab work & pertinent test results  History of Anesthesia Complications Negative for: history of anesthetic complications  Airway Mallampati: III  TM Distance: <3 FB Neck ROM: full    Dental  (+) Poor Dentition   Pulmonary neg shortness of breath, former smoker,    Pulmonary exam normal breath sounds clear to auscultation       Cardiovascular Exercise Tolerance: Good (-) angina(-) Past MI and (-) DOE negative cardio ROS Normal cardiovascular exam Rhythm:regular Rate:Normal     Neuro/Psych negative neurological ROS  negative psych ROS   GI/Hepatic Neg liver ROS, GERD  ,  Endo/Other  negative endocrine ROS  Renal/GU negative Renal ROS  negative genitourinary   Musculoskeletal   Abdominal   Peds  Hematology negative hematology ROS (+)   Anesthesia Other Findings Past Medical History:   Scoliosis                                       1995         Vaginal Pap smear, abnormal                                  GERD (gastroesophageal reflux disease)                      Past Surgical History:   NO PAST SURGERIES                                             TUBAL LIGATION                                  N/A 03/21/2016      Comment:Procedure: POST PARTUM TUBAL LIGATION;                Surgeon: Will Bonnet, MD;  Location: [facility]              ORS;  Service: Gynecology;  Laterality: N/A;     Reproductive/Obstetrics negative OB ROS                             Anesthesia Physical Anesthesia Plan  ASA: III  Anesthesia Plan: General ETT   Post-op Pain Management:    Induction:   Airway Management Planned:   Additional Equipment:   Intra-op Plan:   Post-operative Plan:   Informed Consent: I have reviewed the patients History  and Physical, chart, labs and discussed the procedure including the risks, benefits and alternatives for the proposed anesthesia with the patient or authorized representative who has indicated his/her understanding and acceptance.   Dental Advisory Given  Plan Discussed with: Anesthesiologist, CRNA and Surgeon  Anesthesia Plan Comments:         Anesthesia Quick Evaluation

## 2016-05-29 NOTE — Progress Notes (Addendum)
Continues to have nausea. Very drowsy also. Dr. Amie Critchley made aware.  New orders received for Phenergan IV.

## 2016-05-29 NOTE — H&P (View-Only) (Signed)
Patient ID: Allison Flynn, female   DOB: 1981/09/25, 35 y.o.   MRN: [id number]  CC: RUQ PAIN  HPI Allison Flynn is a 35 y.o. female who presents to clinic today for evaluation of right upper quadrant abdominal pain. Patient states that over the last 6 weeks she's had 2 separate attacks of right upper quadrant abdominal pain. Both these occurred after eating fatty meals. She was in the emergency department for this proximally 2 weeks ago because the pain was the worst ever been. Patient has a significant history of being 2 months postpartum. She is still breast-feeding. She states he continues to have a dull right upper quadrant ache that is definitely worsened after eating fatty meals. She denies any fevers, chills, nausea, vomiting, diarrhea, constipation. She is otherwise in her usual state of good health.  HPI  Past Medical History  Diagnosis Date  . Scoliosis 1995  . Vaginal Pap smear, abnormal     Past Surgical History  Procedure Laterality Date  . No past surgeries    . Tubal ligation N/A 03/21/2016    Procedure: POST PARTUM TUBAL LIGATION;  Surgeon: Will Bonnet, MD;  Location: [facility] ORS;  Service: Gynecology;  Laterality: N/A;    Family History  Problem Relation Age of Onset  . Heart disease Maternal Grandmother   . Deep vein thrombosis Maternal Grandmother   . Anemia Sister     Social History Social History  Substance Use Topics  . Smoking status: Former Research scientist (life sciences)  . Smokeless tobacco: Never Used  . Alcohol Use: No    No Known Allergies  Current Outpatient Prescriptions  Medication Sig Dispense Refill  . Prenatal Multivit-Min-Fe-FA (PRENATAL VITAMINS) 0.8 MG tablet Take 1 tablet by mouth daily. 90 tablet 3  . dibucaine (NUPERCAINAL) 1 % OINT Place 1 application rectally as needed for hemorrhoids. (Patient not taking: Reported on 05/16/2016) 15 g 0  . ibuprofen (ADVIL,MOTRIN) 600 MG tablet Take 1 tablet (600 mg total) by mouth every 6 (six) hours as  needed for mild pain, moderate pain or cramping. (Patient not taking: Reported on 05/16/2016) 50 tablet 0  . Iron-FA-B Cmp-C-Biot-Probiotic (FUSION PLUS) CAPS Take 1 capsule by mouth daily. (Patient not taking: Reported on 05/16/2016) 30 capsule 1  . oxyCODONE-acetaminophen (PERCOCET/ROXICET) 5-325 MG tablet Take 1-2 tablets by mouth every 6 (six) hours as needed for moderate pain or severe pain. (Patient not taking: Reported on 05/16/2016) 30 tablet 0  . traMADol (ULTRAM) 50 MG tablet Take 1 tablet (50 mg total) by mouth every 6 (six) hours as needed. (Patient not taking: Reported on 05/16/2016) 20 tablet 0   No current facility-administered medications for this visit.     Review of Systems A Multi-point review of systems was asked and was negative except for the findings documented in the history of present illness  Physical Exam Blood pressure 143/88, pulse 76, temperature 96.2 F (35.7 C), temperature source Oral, height 5\' 2"  (1.575 m), weight 87.998 kg (194 lb), unknown if currently breastfeeding. CONSTITUTIONAL: No acute distress. EYES: Pupils are equal, round, and reactive to light, Sclera are non-icteric. EARS, NOSE, MOUTH AND THROAT: The oropharynx is clear. The oral mucosa is pink and moist. Hearing is intact to voice. LYMPH NODES:  Lymph nodes in the neck are normal. RESPIRATORY:  Lungs are clear. There is normal respiratory effort, with equal breath sounds bilaterally, and without pathologic use of accessory muscles. CARDIOVASCULAR: Heart is regular without murmurs, gallops, or rubs. GI: The abdomen is soft, nontender, and  nondistended. There are no palpable masses. There is no hepatosplenomegaly. There are normal bowel sounds in all quadrants. Well-healed laparoscopic tubal ligation incision in the infraumbilical region GU: Rectal deferred.   MUSCULOSKELETAL: Normal muscle strength and tone. No cyanosis or edema.   SKIN: Turgor is good and there are no pathologic skin lesions or  ulcers. NEUROLOGIC: Motor and sensation is grossly normal. Cranial nerves are grossly intact. PSYCH:  Oriented to person, place and time. Affect is normal.  Data Reviewed Images and labs reviewed. Ultrasound of the abdomen does show evidence of cholelithiasis without cholecystitis. There is no gallbladder wall thickening, ductal dilatation, pericholecystic fluid. Her labs are primarily normal limits. I have personally reviewed the patient's imaging, laboratory findings and medical records.    Assessment    Biliary colic    Plan    35 year old female with a history and physical consistent with biliary colic. The procedure lap scopic cholecystectomy was discussed in detail .I discussed the procedure in detail.  The patient was given Neurosurgeon.  We discussed the risks and benefits of a laparoscopic cholecystectomy and possible cholangiogram including, but not limited to bleeding, infection, injury to surrounding structures such as the intestine or liver, bile leak, retained gallstones, need to convert to an open procedure, prolonged diarrhea, blood clots such as  DVT, common bile duct injury, anesthesia risks, and possible need for additional procedures.  The likelihood of improvement in symptoms and return to the patient's normal status is good. We discussed the typical post-operative recovery course. Plan for laparoscopic cholecystectomy on Monday, June 26.      Time spent with the patient was 55 minutes, with more than 50% of the time spent in face-to-face education, counseling and care coordination.     Clayburn Pert, MD FACS General Surgeon 05/16/2016, 1:35 PM

## 2016-05-29 NOTE — Addendum Note (Signed)
Addendum  created 05/29/16 1051 by Andria Frames, MD   Modules edited: Orders, PRL Based Order Sets

## 2016-05-29 NOTE — Anesthesia Procedure Notes (Signed)
Procedure Name: Intubation Performed by: Lance Muss Pre-anesthesia Checklist: Emergency Drugs available, Patient identified, Suction available, Timeout performed and Patient being monitored Patient Re-evaluated:Patient Re-evaluated prior to inductionOxygen Delivery Method: Circle system utilized Preoxygenation: Pre-oxygenation with 100% oxygen Intubation Type: IV induction Ventilation: Mask ventilation without difficulty Laryngoscope Size: Mac and 3 Grade View: Grade I Tube type: Oral Tube size: 7.0 mm Number of attempts: 1 Airway Equipment and Method: Stylet Placement Confirmation: ETT inserted through vocal cords under direct vision,  positive ETCO2 and breath sounds checked- equal and bilateral Secured at: 22 cm Tube secured with: Tape Dental Injury: Teeth and Oropharynx as per pre-operative assessment

## 2016-05-29 NOTE — Transfer of Care (Signed)
Immediate Anesthesia Transfer of Care Note  Patient: Allison Flynn  Procedure(s) Performed: Procedure(s): LAPAROSCOPIC CHOLECYSTECTOMY  Patient Location: PACU  Anesthesia Type:General  Level of Consciousness: sedated and responds to stimulation  Airway & Oxygen Therapy: Patient Spontanous Breathing and Patient connected to face mask oxygen  Post-op Assessment: Report given to RN and Post -op Vital signs reviewed and stable  Post vital signs: Reviewed and stable  Last Vitals:  Filed Vitals:   05/29/16 0839 05/29/16 0842  BP: 125/74 124/74  Pulse: 71 65  Temp:    Resp: 19 16    Last Pain:  Filed Vitals:   05/29/16 0842  PainSc: Asleep         Complications: No apparent anesthesia complications

## 2016-05-29 NOTE — Progress Notes (Signed)
   05/29/16 0700  Clinical Encounter Type  Visited With Patient and family together  Visit Type Initial  Referral From Nurse  Consult/Referral To Chaplain  Spiritual Encounters  Spiritual Needs Other (Comment) (Advanced Directive)  Stress Factors  Patient Stress Factors Other (Comment) (nervous about surgery)  Family Stress Factors None identified  Patient very nervous about her surgery.  Advanced directive done.

## 2016-05-30 ENCOUNTER — Telehealth: Payer: Self-pay | Admitting: General Surgery

## 2016-05-30 NOTE — Telephone Encounter (Signed)
Returned phone call to patient at this time. Explained that the gas placed into the Abdomen in order to visualize the gallbladder and other structures will cause this pain and this is expected after surgery. Encouraged patient to walk as much as possible to help in breathing this gas out. She verbalizes understanding of this and is encouraged to call back with any further questions or concerns.

## 2016-05-30 NOTE — Telephone Encounter (Signed)
Patient had LAPAROSCOPIC CHOLECYSTECTOMY yesterday (6/26) with Dr Adonis Huguenin. She is having a lot of pain in her shoulder that has been increasing and would like to speak with the nurse reference same. Please call at your convenience.

## 2016-05-31 LAB — SURGICAL PATHOLOGY

## 2016-06-05 ENCOUNTER — Observation Stay
Admission: EM | Admit: 2016-06-05 | Discharge: 2016-06-07 | Disposition: A | Discharge status: Home / Self Care | Payer: Medicaid Other | Attending: Surgery | Admitting: Surgery

## 2016-06-05 ENCOUNTER — Emergency Department: Payer: Medicaid Other

## 2016-06-05 ENCOUNTER — Encounter: Payer: Self-pay | Admitting: Emergency Medicine

## 2016-06-05 DIAGNOSIS — Z87891 Personal history of nicotine dependence: Secondary | ICD-10-CM | POA: Insufficient documentation

## 2016-06-05 DIAGNOSIS — K219 Gastro-esophageal reflux disease without esophagitis: Secondary | ICD-10-CM | POA: Insufficient documentation

## 2016-06-05 DIAGNOSIS — K429 Umbilical hernia without obstruction or gangrene: Secondary | ICD-10-CM | POA: Insufficient documentation

## 2016-06-05 DIAGNOSIS — Z9049 Acquired absence of other specified parts of digestive tract: Secondary | ICD-10-CM | POA: Diagnosis not present

## 2016-06-05 DIAGNOSIS — Z9851 Tubal ligation status: Secondary | ICD-10-CM | POA: Diagnosis not present

## 2016-06-05 DIAGNOSIS — R52 Pain, unspecified: Secondary | ICD-10-CM | POA: Diagnosis present

## 2016-06-05 DIAGNOSIS — K859 Acute pancreatitis without necrosis or infection, unspecified: Principal | ICD-10-CM | POA: Insufficient documentation

## 2016-06-05 DIAGNOSIS — Z832 Family history of diseases of the blood and blood-forming organs and certain disorders involving the immune mechanism: Secondary | ICD-10-CM | POA: Insufficient documentation

## 2016-06-05 DIAGNOSIS — R109 Unspecified abdominal pain: Secondary | ICD-10-CM

## 2016-06-05 DIAGNOSIS — Z8249 Family history of ischemic heart disease and other diseases of the circulatory system: Secondary | ICD-10-CM | POA: Diagnosis not present

## 2016-06-05 DIAGNOSIS — K8050 Calculus of bile duct without cholangitis or cholecystitis without obstruction: Secondary | ICD-10-CM | POA: Diagnosis present

## 2016-06-05 LAB — COMPREHENSIVE METABOLIC PANEL
ALBUMIN: 4.2 g/dL (ref 3.5–5.0)
ALK PHOS: 70 U/L (ref 38–126)
ALT: 63 U/L — AB (ref 14–54)
AST: 107 U/L — ABNORMAL HIGH (ref 15–41)
Anion gap: 7 (ref 5–15)
BUN: 14 mg/dL (ref 6–20)
CALCIUM: 9.2 mg/dL (ref 8.9–10.3)
CO2: 25 mmol/L (ref 22–32)
CREATININE: 0.77 mg/dL (ref 0.44–1.00)
Chloride: 106 mmol/L (ref 101–111)
GFR calc Af Amer: >60 mL/min (ref >60)
GFR calc non Af Amer: >60 mL/min (ref >60)
GLUCOSE: 111 mg/dL — AB (ref 65–99)
Potassium: 3.5 mmol/L (ref 3.5–5.1)
SODIUM: 138 mmol/L (ref 135–145)
Total Bilirubin: 0.6 mg/dL (ref 0.3–1.2)
Total Protein: 7.1 g/dL (ref 6.5–8.1)

## 2016-06-05 LAB — URINALYSIS COMPLETE WITH MICROSCOPIC (ARMC ONLY)
BILIRUBIN URINE: NEGATIVE
GLUCOSE, UA: NEGATIVE mg/dL
KETONES UR: NEGATIVE mg/dL
Nitrite: NEGATIVE
PROTEIN: NEGATIVE mg/dL
Specific Gravity, Urine: 1.016 (ref 1.005–1.030)
pH: 5 (ref 5.0–8.0)

## 2016-06-05 LAB — CBC
HCT: 34.3 % — ABNORMAL LOW (ref 35.0–47.0)
Hemoglobin: 12.3 g/dL (ref 12.0–16.0)
MCH: 30.6 pg (ref 26.0–34.0)
MCHC: 35.8 g/dL (ref 32.0–36.0)
MCV: 85.6 fL (ref 80.0–100.0)
PLATELETS: 252 10*3/uL (ref 150–440)
RBC: 4 MIL/uL (ref 3.80–5.20)
RDW: 12.4 % (ref 11.5–14.5)
WBC: 9.2 10*3/uL (ref 3.6–11.0)

## 2016-06-05 LAB — TROPONIN I: Troponin I: <0.03 ng/mL (ref <0.03)

## 2016-06-05 LAB — LIPASE, BLOOD: Lipase: 391 U/L — ABNORMAL HIGH (ref 11–51)

## 2016-06-05 LAB — PREGNANCY, URINE: Preg Test, Ur: NEGATIVE

## 2016-06-05 MED ORDER — HYDROCODONE-ACETAMINOPHEN 5-325 MG PO TABS
1.0000 | ORAL_TABLET | Freq: Four times a day (QID) | ORAL | Status: DC | PRN
Start: 2016-06-05 — End: 2016-06-07

## 2016-06-05 MED ORDER — HEPARIN SODIUM (PORCINE) 5000 UNIT/ML IJ SOLN
5000.0000 [IU] | Freq: Three times a day (TID) | INTRAMUSCULAR | Status: DC
  Administered 2016-06-05 – 2016-06-07 (×6): 5000 [IU] via SUBCUTANEOUS
  Filled 2016-06-05: qty 2
  Filled 2016-06-05 (×3): qty 1

## 2016-06-05 MED ORDER — AMPICILLIN-SULBACTAM SODIUM 3 (2-1) G IJ SOLR
3.0000 g | Freq: Four times a day (QID) | INTRAMUSCULAR | Status: DC
  Administered 2016-06-05 – 2016-06-07 (×8): 3 g via INTRAVENOUS
  Filled 2016-06-05 (×12): qty 3

## 2016-06-05 MED ORDER — IOPAMIDOL (ISOVUE-370) INJECTION 76%
100.0000 mL | Freq: Once | INTRAVENOUS | Status: AC | PRN
Start: 2016-06-05 — End: 2016-06-05
  Administered 2016-06-05: 100 mL via INTRAVENOUS

## 2016-06-05 MED ORDER — LORAZEPAM 2 MG/ML IJ SOLN
1.0000 mg | Freq: Once | INTRAMUSCULAR | Status: DC
  Filled 2016-06-05: qty 1

## 2016-06-05 MED ORDER — LACTATED RINGERS IV SOLN
INTRAVENOUS | Status: DC
  Administered 2016-06-05 – 2016-06-07 (×4): via INTRAVENOUS

## 2016-06-05 MED ORDER — DIATRIZOATE MEGLUMINE & SODIUM 66-10 % PO SOLN
15.0000 mL | ORAL | Status: DC
  Administered 2016-06-05: 15 mL via ORAL

## 2016-06-05 MED ORDER — HYDROMORPHONE HCL 1 MG/ML IJ SOLN: 0.5000 mg | INTRAMUSCULAR | Status: DC | PRN

## 2016-06-05 NOTE — ED Provider Notes (Signed)
[facility] Emergency Department Provider Note   ____________________________________________  Time seen: Approximately 5:27 AM  I have reviewed the triage vital signs and the nursing notes.   HISTORY  Chief Complaint Abdominal Pain    HPI ELAN TETZLOFF is a 35 y.o. female Patient reports onset of abdominal pain this evening. Feels just like her gallbladder pain but is more diffuse up under the ribs from one side to the other. She is nauseated and has not vomited no diarrhea. She is not running a fever. Nothing really seems to make it better or worse. It is moderate in nature and no other associated medical conditions or problems   Past Medical History  Diagnosis Date  . Scoliosis 1995  . Vaginal Pap smear, abnormal   . GERD (gastroesophageal reflux disease)     Patient Active Problem List   Diagnosis Date Noted  . Biliary colic   . Cholelithiasis 05/16/2016  . Postpartum care following vaginal delivery 03/20/2016  . Idiopathic scoliosis 09/20/2015    Past Surgical History  Procedure Laterality Date  . No past surgeries    . Tubal ligation N/A 03/21/2016    Procedure: POST PARTUM TUBAL LIGATION;  Surgeon: Will Bonnet, MD;  Location: [facility] ORS;  Service: Gynecology;  Laterality: N/A;  . Cholecystectomy  05/29/2016    Procedure: LAPAROSCOPIC CHOLECYSTECTOMY;  Surgeon: Clayburn Pert, MD;  Location: [facility] ORS;  Service: General;;    Current Outpatient Rx  Name  Route  Sig  Dispense  Refill  . HYDROcodone-acetaminophen (NORCO) 5-325 MG tablet   Oral   Take 1 tablet by mouth every 6 (six) hours as needed for moderate pain.   30 tablet   0   . ibuprofen (ADVIL,MOTRIN) 600 MG tablet   Oral   Take 1 tablet (600 mg total) by mouth every 6 (six) hours as needed for mild pain, moderate pain or cramping.   50 tablet   0     Allergies Review of patient's allergies indicates no known allergies.  Family History  Problem Relation  Age of Onset  . Heart disease Maternal Grandmother   . Deep vein thrombosis Maternal Grandmother   . Anemia Sister     Social History Social History  Substance Use Topics  . Smoking status: Former Smoker    Quit date: 07/23/2015  . Smokeless tobacco: Never Used  . Alcohol Use: No    Review of Systems Constitutional: No fever/chills Eyes: No visual changes. ENT: No sore throat. Cardiovascular: Denies chest pain. Respiratory: Denies shortness of breath. Gastrointestinal: abdominal pain.   nausea, no vomiting.  No diarrhea.  No constipation. Genitourinary: Negative for dysuria. Musculoskeletal: Negative for back pain. Skin: Negative for rash.  10-point ROS otherwise negative.  ____________________________________________   PHYSICAL EXAM:  VITAL SIGNS: ED Triage Vitals  Enc Vitals Group     BP 06/05/16 0017 117/66 mmHg     Pulse Rate 06/05/16 0017 71     Resp 06/05/16 0017 18     Temp 06/05/16 0017 97.4 F (36.3 C)     Temp Source 06/05/16 0017 Oral     SpO2 06/05/16 0017 98 %     Weight 06/05/16 0017 180 lb (81.647 kg)     Height 06/05/16 0017 5\' 2"  (1.575 m)     Head Cir --      Peak Flow --      Pain Score 06/05/16 0018 5     Pain Loc --  Pain Edu? --      Excl. in [place]? --     Constitutional: Alert and oriented. Looks uncomfortable Eyes: Conjunctivae are normal. PERRL. EOMI. Head: Atraumatic. Nose: No congestion/rhinnorhea. Mouth/Throat: Mucous membranes are moist.  Oropharynx non-erythematous. Neck: No stridor.  Cardiovascular: Normal rate, regular rhythm. Grossly normal heart sounds.  Good peripheral circulation. Respiratory: Normal respiratory effort.  No retractions. Lungs CTAB. Gastrointestinal: Soft tender in the upper abdomen palpation slightly to percussion No distention. No abdominal bruits. No CVA tenderness. Musculoskeletal: No lower extremity tenderness nor edema.  No joint effusions. Neurologic:  Normal speech and language. No gross focal  neurologic deficits are appreciated. No gait instability. Skin:  Skin is warm, dry and intact. No rash noted. Psychiatric: Mood and affect are normal. Speech and behavior are normal.  ____________________________________________   LABS (all labs ordered are listed, but only abnormal results are displayed)  Labs Reviewed  LIPASE, BLOOD - Abnormal; Notable for the following:    Lipase 391 (*)    All other components within normal limits  COMPREHENSIVE METABOLIC PANEL - Abnormal; Notable for the following:    Glucose, Bld 111 (*)    AST 107 (*)    ALT 63 (*)    All other components within normal limits  CBC - Abnormal; Notable for the following:    HCT 34.3 (*)    All other components within normal limits  URINALYSIS COMPLETEWITH MICROSCOPIC ([facility] ONLY) - Abnormal; Notable for the following:    Color, Urine YELLOW (*)    APPearance CLOUDY (*)    Hgb urine dipstick 2+ (*)    Leukocytes, UA TRACE (*)    Bacteria, UA RARE (*)    Squamous Epithelial / LPF 6-30 (*)    All other components within normal limits  TROPONIN I  PREGNANCY, URINE   ____________________________________________  EKG   ____________________________________________  RADIOLOGY  CT read as only showing moderate stool. ____________________________________________   PROCEDURES    ____________________________________________   INITIAL IMPRESSION / ASSESSMENT AND PLAN / ED COURSE  Pertinent labs & imaging results that were available during my care of the patient were reviewed by me and considered in my medical decision making (see chart for details).   ____________________________________________   FINAL CLINICAL IMPRESSION(S) / ED DIAGNOSES  Final diagnoses:  Acute pancreatitis, unspecified pancreatitis type      NEW MEDICATIONS STARTED DURING THIS VISIT:  New Prescriptions   No medications on file     Note:  This document was prepared using Dragon voice recognition software and may  include unintentional dictation errors.    Nena Polio, MD 06/05/16 0530

## 2016-06-05 NOTE — ED Notes (Signed)
Social worker with husband and patient.

## 2016-06-05 NOTE — ED Notes (Signed)
MD at bedside. 

## 2016-06-05 NOTE — ED Notes (Signed)
poct urine pregnancy negative 

## 2016-06-05 NOTE — ED Notes (Signed)
Patient transported to CT 

## 2016-06-05 NOTE — H&P (Signed)
Allison Flynn is an 35 y.o. female.    Chief Complaint: Epigastric pain  HPI: This a patient who is one-week status post laparoscopic cholecystectomy with with no cholangiograms and normal LFTs prior to surgery. She was doing well until last night she went to sleep and woke up about an hour later with epigastric pain some nausea some shortness of breath and minimal back pain. She's never had an episode like this before but states that in some ways it was like her preoperative condition. Workup suggested no sign of bile leak and no pulmonary embolus but possible retained stone.  Past Medical History  Diagnosis Date  . Scoliosis 1995  . Vaginal Pap smear, abnormal   . GERD (gastroesophageal reflux disease)     Past Surgical History  Procedure Laterality Date  . No past surgeries    . Tubal ligation N/A 03/21/2016    Procedure: POST PARTUM TUBAL LIGATION;  Surgeon: Will Bonnet, MD;  Location: [facility] ORS;  Service: Gynecology;  Laterality: N/A;  . Cholecystectomy  05/29/2016    Procedure: LAPAROSCOPIC CHOLECYSTECTOMY;  Surgeon: Clayburn Pert, MD;  Location: [facility] ORS;  Service: General;;    Family History  Problem Relation Age of Onset  . Heart disease Maternal Grandmother   . Deep vein thrombosis Maternal Grandmother   . Anemia Sister    Social History:  reports that she quit smoking about 10 months ago. She has never used smokeless tobacco. She reports that she does not drink alcohol or use illicit drugs.  Allergies: No Known Allergies   (Not in a hospital admission)   Review of Systems  Constitutional: Negative for fever and chills.  HENT: Negative.   Eyes: Negative.   Respiratory: Positive for shortness of breath. Negative for cough, hemoptysis and wheezing.   Cardiovascular: Negative.   Gastrointestinal: Positive for nausea and abdominal pain. Negative for vomiting, diarrhea and constipation.  Genitourinary: Negative.   Musculoskeletal: Negative.   Skin:  Negative.   Neurological: Negative.   Endo/Heme/Allergies: Negative.   Psychiatric/Behavioral: Negative.      Physical Exam:  BP 112/70 mmHg  Pulse 71  Temp(Src) 97.4 F (36.3 C) (Oral)  Resp 18  Ht _0  (1.575 m)  Wt 180 lb (81.647 kg)  BMI 32.91 kg/m2  SpO2 97%  LMP 05/29/2016  Physical Exam  Constitutional: She is oriented to person, place, and time and well-developed, well-nourished, and in no distress. No distress.  HENT:  Head: Normocephalic and atraumatic.  Eyes: Pupils are equal, round, and reactive to light. Right eye exhibits no discharge. Left eye exhibits no discharge. No scleral icterus.  Cardiovascular: Normal rate, regular rhythm and normal heart sounds.   Pulmonary/Chest: Effort normal and breath sounds normal. No respiratory distress. She has no wheezes. She has no rales.  Abdominal: Soft. She exhibits no distension. There is no tenderness. There is no rebound and no guarding.  Wounds clean without erythema  Musculoskeletal: Normal range of motion. She exhibits no edema.  Lymphadenopathy:    She has no cervical adenopathy.  Neurological: She is alert and oriented to person, place, and time.  Skin: Skin is warm and dry. She is not diaphoretic.  No jaundice  Psychiatric: Mood and affect normal.  Vitals reviewed.       Results for orders placed or performed during the hospital encounter of 06/05/16 (from the past 48 hour(s))  Lipase, blood     Status: Abnormal   Collection Time: 06/05/16 12:41 AM  Result Value Ref Range  Lipase 391 (H) 11 - 51 U/L  Comprehensive metabolic panel     Status: Abnormal   Collection Time: 06/05/16 12:41 AM  Result Value Ref Range   Sodium 138 135 - 145 mmol/L   Potassium 3.5 3.5 - 5.1 mmol/L   Chloride 106 101 - 111 mmol/L   CO2 25 22 - 32 mmol/L   Glucose, Bld 111 (H) 65 - 99 mg/dL   BUN 14 6 - 20 mg/dL   Creatinine, Ser 0.77 0.44 - 1.00 mg/dL   Calcium 9.2 8.9 - 10.3 mg/dL   Total Protein 7.1 6.5 - 8.1 g/dL    Albumin 4.2 3.5 - 5.0 g/dL   AST 107 (H) 15 - 41 U/L   ALT 63 (H) 14 - 54 U/L   Alkaline Phosphatase 70 38 - 126 U/L   Total Bilirubin 0.6 0.3 - 1.2 mg/dL   GFR calc non Af Amer >60 >60 mL/min   GFR calc Af Amer >60 >60 mL/min    Comment: (NOTE) The eGFR has been calculated using the CKD EPI equation. This calculation has not been validated in all clinical situations. eGFR's persistently <60 mL/min signify possible Chronic Kidney Disease.    Anion gap 7 5 - 15  CBC     Status: Abnormal   Collection Time: 06/05/16 12:41 AM  Result Value Ref Range   WBC 9.2 3.6 - 11.0 K/uL   RBC 4.00 3.80 - 5.20 MIL/uL   Hemoglobin 12.3 12.0 - 16.0 g/dL   HCT 34.3 (L) 35.0 - 47.0 %   MCV 85.6 80.0 - 100.0 fL   MCH 30.6 26.0 - 34.0 pg   MCHC 35.8 32.0 - 36.0 g/dL   RDW 12.4 11.5 - 14.5 %   Platelets 252 150 - 440 K/uL  Troponin I     Status: None   Collection Time: 06/05/16 12:41 AM  Result Value Ref Range   Troponin I <0.03 <0.03 ng/mL  Urinalysis complete, with microscopic     Status: Abnormal   Collection Time: 06/05/16  1:33 AM  Result Value Ref Range   Color, Urine YELLOW (A) YELLOW   APPearance CLOUDY (A) CLEAR   Glucose, UA NEGATIVE NEGATIVE mg/dL   Bilirubin Urine NEGATIVE NEGATIVE   Ketones, ur NEGATIVE NEGATIVE mg/dL   Specific Gravity, Urine 1.016 1.005 - 1.030   Hgb urine dipstick 2+ (A) NEGATIVE   pH 5.0 5.0 - 8.0   Protein, ur NEGATIVE NEGATIVE mg/dL   Nitrite NEGATIVE NEGATIVE   Leukocytes, UA TRACE (A) NEGATIVE   RBC / HPF 0-5 0 - 5 RBC/hpf   WBC, UA 6-30 0 - 5 WBC/hpf   Bacteria, UA RARE (A) NONE SEEN   Squamous Epithelial / LPF 6-30 (A) NONE SEEN   Mucous PRESENT   Pregnancy, urine     Status: None   Collection Time: 06/05/16  1:33 AM  Result Value Ref Range   Preg Test, Ur NEGATIVE NEGATIVE   Ct Angio Chest Pe W/cm &/or Wo Cm  06/05/2016  CLINICAL DATA:  Dyspnea and upper abdominal pain, onset last night. Cholecystectomy 7 days prior. EXAM: CT ANGIOGRAPHY CHEST CT  ABDOMEN AND PELVIS WITH CONTRAST TECHNIQUE: Multidetector CT imaging of the chest was performed using the standard protocol during bolus administration of intravenous contrast. Multiplanar CT image reconstructions and MIPs were obtained to evaluate the vascular anatomy. Multidetector CT imaging of the abdomen and pelvis was performed using the standard protocol during bolus administration of intravenous contrast. CONTRAST:  100 cc Isovue  370 IV COMPARISON:  None. FINDINGS: CTA CHEST FINDINGS Cardiovascular: There are no filling defects within the pulmonary arteries to suggest pulmonary embolus. No aortic dissection or aneurysm. Mediastinum/Lymph nodes: No adenopathy. Minimal soft tissue density anterior mediastinum likely a residual or recurrent thymus. No pericardial fluid. The esophagus appears patulous. Lungs/Pleura: Trachea mainstem bronchi are patent. No consolidation, pulmonary mass or suspicious nodule. No pleural effusion. Musculoskeletal: Broad-based dextroscoliosis of thoracic spine. There are no acute or suspicious osseous abnormalities. CT ABDOMEN and PELVIS FINDINGS Hepatobiliary: Postcholecystectomy with clips in the gallbladder fossa. No gallbladder fossa fluid collection. The liver appears prominent in size. No focal lesion or hepatic collection. No biliary dilatation. Pancreas: No ductal dilatation or inflammation. Spleen: Upper normal in size. Adrenals/Urinary tract: Symmetric renal enhancement and excretion. No hydronephrosis. Normal adrenal glands. Urinary bladder physiologically distended. Stomach/Bowel: Stomach distended with ingested contents. No small bowel dilatation or obstruction. Enteric contrast reaches the colon. Moderate diffuse stool burden. No bowel inflammation. The appendix is normal. Vascular/Lymphatic: Normal caliber aorta. No retroperitoneal adenopathy. No mesenteric or pelvic adenopathy. Reproductive: Uterus is prominent in size. Ovaries symmetric. No evidence of adnexal mass.  Physiologic free fluid in the pelvis. Other: No free air. No intra-abdominal abscess. Tiny fat containing umbilical hernia. No subcutaneous collection. Musculoskeletal: Mild broad-based scoliosis. There are no acute or suspicious osseous abnormalities. Review of the MIP images confirms the above findings. IMPRESSION: 1. No pulmonary embolus. 2. Patulous esophagus, can be seen with reflux. 3. Postcholecystectomy. No complication, no abscess or fluid collection in the gallbladder fossa. 4. Moderate stool burden, suspect slow transit/constipation. Electronically Signed   By: Jeb Levering M.D.   On: 06/05/2016 03:58   Ct Abdomen Pelvis W Contrast  06/05/2016  CLINICAL DATA:  Dyspnea and upper abdominal pain, onset last night. Cholecystectomy 7 days prior. EXAM: CT ANGIOGRAPHY CHEST CT ABDOMEN AND PELVIS WITH CONTRAST TECHNIQUE: Multidetector CT imaging of the chest was performed using the standard protocol during bolus administration of intravenous contrast. Multiplanar CT image reconstructions and MIPs were obtained to evaluate the vascular anatomy. Multidetector CT imaging of the abdomen and pelvis was performed using the standard protocol during bolus administration of intravenous contrast. CONTRAST:  100 cc Isovue 370 IV COMPARISON:  None. FINDINGS: CTA CHEST FINDINGS Cardiovascular: There are no filling defects within the pulmonary arteries to suggest pulmonary embolus. No aortic dissection or aneurysm. Mediastinum/Lymph nodes: No adenopathy. Minimal soft tissue density anterior mediastinum likely a residual or recurrent thymus. No pericardial fluid. The esophagus appears patulous. Lungs/Pleura: Trachea mainstem bronchi are patent. No consolidation, pulmonary mass or suspicious nodule. No pleural effusion. Musculoskeletal: Broad-based dextroscoliosis of thoracic spine. There are no acute or suspicious osseous abnormalities. CT ABDOMEN and PELVIS FINDINGS Hepatobiliary: Postcholecystectomy with clips in the  gallbladder fossa. No gallbladder fossa fluid collection. The liver appears prominent in size. No focal lesion or hepatic collection. No biliary dilatation. Pancreas: No ductal dilatation or inflammation. Spleen: Upper normal in size. Adrenals/Urinary tract: Symmetric renal enhancement and excretion. No hydronephrosis. Normal adrenal glands. Urinary bladder physiologically distended. Stomach/Bowel: Stomach distended with ingested contents. No small bowel dilatation or obstruction. Enteric contrast reaches the colon. Moderate diffuse stool burden. No bowel inflammation. The appendix is normal. Vascular/Lymphatic: Normal caliber aorta. No retroperitoneal adenopathy. No mesenteric or pelvic adenopathy. Reproductive: Uterus is prominent in size. Ovaries symmetric. No evidence of adnexal mass. Physiologic free fluid in the pelvis. Other: No free air. No intra-abdominal abscess. Tiny fat containing umbilical hernia. No subcutaneous collection. Musculoskeletal: Mild broad-based scoliosis. There are no acute  or suspicious osseous abnormalities. Review of the MIP images confirms the above findings. IMPRESSION: 1. No pulmonary embolus. 2. Patulous esophagus, can be seen with reflux. 3. Postcholecystectomy. No complication, no abscess or fluid collection in the gallbladder fossa. 4. Moderate stool burden, suspect slow transit/constipation. Electronically Signed   By: Jeb Levering M.D.   On: 06/05/2016 03:58     Assessment/Plan  Suspect retained stone with mildly elevated liver function tests and lipase. Plan is to admit the patient on hospital control her nausea and pain and start IV antibiotics and repeat LFTs in the morning should she have elevated LFTs tomorrow than I would ask for a GI consult for possible ERCP and stone extraction if that happens to be  Florene Glen, MD, FACS

## 2016-06-05 NOTE — ED Provider Notes (Signed)
I have discussed the case with Dr. Burt Knack from general surgery who will come to the ER to evaluate the patient and likely perform ERCP  Earleen Newport, MD 06/05/16 1114

## 2016-06-05 NOTE — ED Notes (Signed)
Patient brought in by ems from home. Patient with complaint of upper abd pain that she reports started around 22:30 tonight. Patient had her gallbladder removed on Monday. Patient denies any nausea or vomiting tonight.

## 2016-06-06 LAB — COMPREHENSIVE METABOLIC PANEL
ALBUMIN: 3.9 g/dL (ref 3.5–5.0)
ALT: 219 U/L — ABNORMAL HIGH (ref 14–54)
ANION GAP: 5 (ref 5–15)
AST: 88 U/L — AB (ref 15–41)
Alkaline Phosphatase: 87 U/L (ref 38–126)
BILIRUBIN TOTAL: 0.8 mg/dL (ref 0.3–1.2)
BUN: 8 mg/dL (ref 6–20)
CO2: 30 mmol/L (ref 22–32)
Calcium: 9.3 mg/dL (ref 8.9–10.3)
Chloride: 107 mmol/L (ref 101–111)
Creatinine, Ser: 0.75 mg/dL (ref 0.44–1.00)
GFR calc Af Amer: >60 mL/min (ref >60)
GFR calc non Af Amer: >60 mL/min (ref >60)
GLUCOSE: 101 mg/dL — AB (ref 65–99)
POTASSIUM: 3.6 mmol/L (ref 3.5–5.1)
SODIUM: 142 mmol/L (ref 135–145)
TOTAL PROTEIN: 6.9 g/dL (ref 6.5–8.1)

## 2016-06-06 LAB — CBC
HEMATOCRIT: 34.3 % — AB (ref 35.0–47.0)
HEMOGLOBIN: 12.5 g/dL (ref 12.0–16.0)
MCH: 31.5 pg (ref 26.0–34.0)
MCHC: 36.4 g/dL — AB (ref 32.0–36.0)
MCV: 86.4 fL (ref 80.0–100.0)
Platelets: 220 10*3/uL (ref 150–440)
RBC: 3.97 MIL/uL (ref 3.80–5.20)
RDW: 12.7 % (ref 11.5–14.5)
WBC: 7.7 10*3/uL (ref 3.6–11.0)

## 2016-06-06 LAB — LIPASE, BLOOD: Lipase: 41 U/L (ref 11–51)

## 2016-06-06 NOTE — Progress Notes (Signed)
CC: Possible retained stone Subjective: Patient feels better today she has less pain no nausea or vomiting no fevers or chills  Objective: Vital signs in last 24 hours: Temp:  [97.7 F (36.5 C)-98 F (36.7 C)] 98 F (36.7 C) (07/04 0426) Pulse Rate:  [65-74] 74 (07/04 0426) Resp:  [16-18] 18 (07/04 0426) BP: (107-121)/(57-79) 110/57 mmHg (07/04 0426) SpO2:  [97 %-100 %] 99 % (07/04 0426) Last BM Date: 06/04/16  Intake/Output from previous day: 07/03 0701 - 07/04 0700 In: 1730 [P.O.:480; I.V.:1150; IV Piggyback:100] Out: 1250 [Urine:1250] Intake/Output this shift: Total I/O In: 185 [I.V.:185] Out: -   Physical exam:  No icterus no jaundice abdomen is soft nontender wounds are clean without erythema nontender calves  Lab Results: CBC   Recent Labs  06/05/16 0041 06/06/16 0449  WBC 9.2 7.7  HGB 12.3 12.5  HCT 34.3* 34.3*  PLT 252 220   BMET  Recent Labs  06/05/16 0041 06/06/16 0449  NA 138 142  K 3.5 3.6  CL 106 107  CO2 25 30  GLUCOSE 111* 101*  BUN 14 8  CREATININE 0.77 0.75  CALCIUM 9.2 9.3   PT/INR No results for input(s): LABPROT, INR in the last 72 hours. ABG No results for input(s): PHART, HCO3 in the last 72 hours.  Invalid input(s): PCO2, PO2  Studies/Results: Ct Angio Chest Pe W/cm &/or Wo Cm  06/05/2016  CLINICAL DATA:  Dyspnea and upper abdominal pain, onset last night. Cholecystectomy 7 days prior. EXAM: CT ANGIOGRAPHY CHEST CT ABDOMEN AND PELVIS WITH CONTRAST TECHNIQUE: Multidetector CT imaging of the chest was performed using the standard protocol during bolus administration of intravenous contrast. Multiplanar CT image reconstructions and MIPs were obtained to evaluate the vascular anatomy. Multidetector CT imaging of the abdomen and pelvis was performed using the standard protocol during bolus administration of intravenous contrast. CONTRAST:  100 cc Isovue 370 IV COMPARISON:  None. FINDINGS: CTA CHEST FINDINGS Cardiovascular: There are no  filling defects within the pulmonary arteries to suggest pulmonary embolus. No aortic dissection or aneurysm. Mediastinum/Lymph nodes: No adenopathy. Minimal soft tissue density anterior mediastinum likely a residual or recurrent thymus. No pericardial fluid. The esophagus appears patulous. Lungs/Pleura: Trachea mainstem bronchi are patent. No consolidation, pulmonary mass or suspicious nodule. No pleural effusion. Musculoskeletal: Broad-based dextroscoliosis of thoracic spine. There are no acute or suspicious osseous abnormalities. CT ABDOMEN and PELVIS FINDINGS Hepatobiliary: Postcholecystectomy with clips in the gallbladder fossa. No gallbladder fossa fluid collection. The liver appears prominent in size. No focal lesion or hepatic collection. No biliary dilatation. Pancreas: No ductal dilatation or inflammation. Spleen: Upper normal in size. Adrenals/Urinary tract: Symmetric renal enhancement and excretion. No hydronephrosis. Normal adrenal glands. Urinary bladder physiologically distended. Stomach/Bowel: Stomach distended with ingested contents. No small bowel dilatation or obstruction. Enteric contrast reaches the colon. Moderate diffuse stool burden. No bowel inflammation. The appendix is normal. Vascular/Lymphatic: Normal caliber aorta. No retroperitoneal adenopathy. No mesenteric or pelvic adenopathy. Reproductive: Uterus is prominent in size. Ovaries symmetric. No evidence of adnexal mass. Physiologic free fluid in the pelvis. Other: No free air. No intra-abdominal abscess. Tiny fat containing umbilical hernia. No subcutaneous collection. Musculoskeletal: Mild broad-based scoliosis. There are no acute or suspicious osseous abnormalities. Review of the MIP images confirms the above findings. IMPRESSION: 1. No pulmonary embolus. 2. Patulous esophagus, can be seen with reflux. 3. Postcholecystectomy. No complication, no abscess or fluid collection in the gallbladder fossa. 4. Moderate stool burden, suspect  slow transit/constipation. Electronically Signed   By: Threasa Beards  Ehinger M.D.   On: 06/05/2016 03:58   Ct Abdomen Pelvis W Contrast  06/05/2016  CLINICAL DATA:  Dyspnea and upper abdominal pain, onset last night. Cholecystectomy 7 days prior. EXAM: CT ANGIOGRAPHY CHEST CT ABDOMEN AND PELVIS WITH CONTRAST TECHNIQUE: Multidetector CT imaging of the chest was performed using the standard protocol during bolus administration of intravenous contrast. Multiplanar CT image reconstructions and MIPs were obtained to evaluate the vascular anatomy. Multidetector CT imaging of the abdomen and pelvis was performed using the standard protocol during bolus administration of intravenous contrast. CONTRAST:  100 cc Isovue 370 IV COMPARISON:  None. FINDINGS: CTA CHEST FINDINGS Cardiovascular: There are no filling defects within the pulmonary arteries to suggest pulmonary embolus. No aortic dissection or aneurysm. Mediastinum/Lymph nodes: No adenopathy. Minimal soft tissue density anterior mediastinum likely a residual or recurrent thymus. No pericardial fluid. The esophagus appears patulous. Lungs/Pleura: Trachea mainstem bronchi are patent. No consolidation, pulmonary mass or suspicious nodule. No pleural effusion. Musculoskeletal: Broad-based dextroscoliosis of thoracic spine. There are no acute or suspicious osseous abnormalities. CT ABDOMEN and PELVIS FINDINGS Hepatobiliary: Postcholecystectomy with clips in the gallbladder fossa. No gallbladder fossa fluid collection. The liver appears prominent in size. No focal lesion or hepatic collection. No biliary dilatation. Pancreas: No ductal dilatation or inflammation. Spleen: Upper normal in size. Adrenals/Urinary tract: Symmetric renal enhancement and excretion. No hydronephrosis. Normal adrenal glands. Urinary bladder physiologically distended. Stomach/Bowel: Stomach distended with ingested contents. No small bowel dilatation or obstruction. Enteric contrast reaches the colon.  Moderate diffuse stool burden. No bowel inflammation. The appendix is normal. Vascular/Lymphatic: Normal caliber aorta. No retroperitoneal adenopathy. No mesenteric or pelvic adenopathy. Reproductive: Uterus is prominent in size. Ovaries symmetric. No evidence of adnexal mass. Physiologic free fluid in the pelvis. Other: No free air. No intra-abdominal abscess. Tiny fat containing umbilical hernia. No subcutaneous collection. Musculoskeletal: Mild broad-based scoliosis. There are no acute or suspicious osseous abnormalities. Review of the MIP images confirms the above findings. IMPRESSION: 1. No pulmonary embolus. 2. Patulous esophagus, can be seen with reflux. 3. Postcholecystectomy. No complication, no abscess or fluid collection in the gallbladder fossa. 4. Moderate stool burden, suspect slow transit/constipation. Electronically Signed   By: Jeb Levering M.D.   On: 06/05/2016 03:58    Anti-infectives: Anti-infectives    Start     Dose/Rate Route Frequency Ordered Stop   06/05/16 1430  Ampicillin-Sulbactam (UNASYN) 3 g in sodium chloride 0.9 % 100 mL IVPB     3 g 100 mL/hr over 60 Minutes Intravenous Every 6 hours 06/05/16 1350        Assessment/Plan:  Lipase is normalized LFTs are elevated still. Normal bilirubin. I suspect that she has passed a stone as the lipase is improved and normalized with that in mind we will recheck her liver function tests tomorrow morning and consider GI consultation if they remain elevated but suspect that this is improved and will likely be able to be discharged tomorrow.  Florene Glen, MD, FACS  06/06/2016

## 2016-06-06 NOTE — Care Management (Signed)
One week s/p lap chole.  Elevated lipase in Ed but returned to normal limits within several hours later.  Continued observation to monitor LFTs and possible gi consult. If sx not improved

## 2016-06-07 LAB — CBC WITH DIFFERENTIAL/PLATELET
BASOS PCT: 0 %
Basophils Absolute: 0 10*3/uL (ref 0–0.1)
EOS ABS: 0.2 10*3/uL (ref 0–0.7)
Eosinophils Relative: 3 %
HEMATOCRIT: 33.9 % — AB (ref 35.0–47.0)
Hemoglobin: 12.1 g/dL (ref 12.0–16.0)
LYMPHS ABS: 3.3 10*3/uL (ref 1.0–3.6)
Lymphocytes Relative: 43 %
MCH: 30.7 pg (ref 26.0–34.0)
MCHC: 35.7 g/dL (ref 32.0–36.0)
MCV: 86.1 fL (ref 80.0–100.0)
MONO ABS: 0.4 10*3/uL (ref 0.2–0.9)
MONOS PCT: 5 %
NEUTROS ABS: 3.8 10*3/uL (ref 1.4–6.5)
Neutrophils Relative %: 49 %
Platelets: 188 10*3/uL (ref 150–440)
RBC: 3.94 MIL/uL (ref 3.80–5.20)
RDW: 12.6 % (ref 11.5–14.5)
WBC: 7.6 10*3/uL (ref 3.6–11.0)

## 2016-06-07 LAB — COMPREHENSIVE METABOLIC PANEL
ALBUMIN: 3.5 g/dL (ref 3.5–5.0)
ALT: 127 U/L — ABNORMAL HIGH (ref 14–54)
ANION GAP: 5 (ref 5–15)
AST: 32 U/L (ref 15–41)
Alkaline Phosphatase: 74 U/L (ref 38–126)
BILIRUBIN TOTAL: 0.6 mg/dL (ref 0.3–1.2)
BUN: 9 mg/dL (ref 6–20)
CO2: 27 mmol/L (ref 22–32)
Calcium: 9 mg/dL (ref 8.9–10.3)
Chloride: 110 mmol/L (ref 101–111)
Creatinine, Ser: 0.71 mg/dL (ref 0.44–1.00)
GFR calc non Af Amer: >60 mL/min (ref >60)
GLUCOSE: 96 mg/dL (ref 65–99)
POTASSIUM: 3.8 mmol/L (ref 3.5–5.1)
Sodium: 142 mmol/L (ref 135–145)
TOTAL PROTEIN: 6.2 g/dL — AB (ref 6.5–8.1)

## 2016-06-07 LAB — LIPASE, BLOOD: LIPASE: 39 U/L (ref 11–51)

## 2016-06-07 MED ORDER — HYDROCODONE-ACETAMINOPHEN 5-325 MG PO TABS: 1.0000 | ORAL_TABLET | ORAL | Status: DC | PRN

## 2016-06-07 MED ORDER — CEPHALEXIN 500 MG PO CAPS: 500.0000 mg | ORAL_CAPSULE | Freq: Four times a day (QID) | ORAL | Status: DC

## 2016-06-07 NOTE — Progress Notes (Signed)
CC: Possible retained stone Subjective: Patient feels much better today her pain is completely gone she has no dark urine no jaundice no acholic stools no fevers or chills. Her pain is clearly gone she is tolerating a diet.  Objective: Vital signs in last 24 hours: Temp:  [97.6 F (36.4 C)-98.6 F (37 C)] 98.6 F (37 C) (07/05 0854) Pulse Rate:  [59-68] 66 (07/05 0854) Resp:  [16-19] 18 (07/05 0854) BP: (107-130)/(44-67) 130/66 mmHg (07/05 0854) SpO2:  [100 %] 100 % (07/05 0854) Last BM Date: 06/06/16  Intake/Output from previous day: 07/04 0701 - 07/05 0700 In: 3140 [P.O.:420; I.V.:2520; IV Piggyback:200] Out: 2050 [Urine:2050] Intake/Output this shift: Total I/O In: 750 [P.O.:750] Out: 1100 [Urine:1100]  Physical exam:  No icterus no jaundice comfortable-appearing female patient vital signs are stable afebrile soft nontender abdomen wounds clean without erythema or drainage nontender calves  Lab Results: CBC   Recent Labs  06/06/16 0449 06/07/16 0428  WBC 7.7 7.6  HGB 12.5 12.1  HCT 34.3* 33.9*  PLT 220 188   BMET  Recent Labs  06/06/16 0449 06/07/16 0428  NA 142 142  K 3.6 3.8  CL 107 110  CO2 30 27  GLUCOSE 101* 96  BUN 8 9  CREATININE 0.75 0.71  CALCIUM 9.3 9.0   PT/INR No results for input(s): LABPROT, INR in the last 72 hours. ABG No results for input(s): PHART, HCO3 in the last 72 hours.  Invalid input(s): PCO2, PO2  Studies/Results: No results found.  Anti-infectives: Anti-infectives    Start     Dose/Rate Route Frequency Ordered Stop   06/07/16 0000  cephALEXin (KEFLEX) 500 MG capsule     500 mg Oral 4 times daily 06/07/16 0957     06/05/16 1430  Ampicillin-Sulbactam (UNASYN) 3 g in sodium chloride 0.9 % 100 mL IVPB     3 g 100 mL/hr over 60 Minutes Intravenous Every 6 hours 06/05/16 1350        Assessment/Plan:  LFTs are improved in fact normalized and all but one category. Lipase is normal now and patient's abdominal pain is  completely gone she is tolerating a diet. I discussed for her potential for discharge today which she prefers to send her home on antibiotics as well as oral analgesics although she is not needing any pain medication at this time she will follow-up with Dr. Adonis Huguenin on Friday who she already has an appointment with. Resume normal activities. I discussed with her the need to call us or to return to the emergency room should her pain and nausea returned. In that case she would need an ERCP.  Florene Glen, MD, FACS  06/07/2016

## 2016-06-07 NOTE — Discharge Instructions (Signed)
Resume normal activities Low-fat diet A shower Follow-up with Dr. Adonis Huguenin next week Restart all home medications Take Keflex as prescribed and oral analgesics as needed

## 2016-06-07 NOTE — Discharge Summary (Signed)
Physician Discharge Summary  Patient ID: Allison Flynn MRN: [id number] DOB/AGE: June 09, 1981 35 y.o.  Admit date: 06/05/2016 Discharge date: 06/07/2016   Discharge Diagnoses:  Active Problems:   Pancreatitis due to common bile duct stone   Procedures:None  Hospital Course: This patient status post laparoscopic cholecystectomy by Dr. Adonis Huguenin 10 days ago or so who presented to the emergency room with abdominal pain and elevated liver function tests and elevated lipase all suggestive of a retained common bile duct stone. Her pain is completely gone at this point and her LFTs have virtually normalized with the exception of one parameter which is halfway was yesterday. She's never had an elevation in her bilirubin. Her lipase is completely normal at this point. She has no back pain.  She is tolerating a diet and will be discharged in stable condition with oral analgesics and antibiotics follow-up Dr. Adonis Huguenin on Friday she had any as an appointment for that  Consults: None  Disposition: 01-Home or Self Care     Medication List    TAKE these medications        cephALEXin 500 MG capsule  Commonly known as:  KEFLEX  Take 1 capsule (500 mg total) by mouth 4 (four) times daily.     HYDROcodone-acetaminophen 5-325 MG tablet  Commonly known as:  NORCO  Take 1 tablet by mouth every 4 (four) hours as needed for moderate pain.     ibuprofen 600 MG tablet  Commonly known as:  ADVIL,MOTRIN  Take 1 tablet (600 mg total) by mouth every 6 (six) hours as needed for mild pain, moderate pain or cramping.           Follow-up Information    Follow up with Clayburn Pert, MD In 7 days.   Specialty:  General Surgery   Contact information:   [address] [place] [address] [phone]       Florene Glen, MD, FACS

## 2016-06-07 NOTE — Progress Notes (Signed)
Patient discharged home at 14:05,instructions explained and well understood,follow up appointment made,priscription given,escorted by spouse and volunteer staff via wheel chair.

## 2016-06-09 ENCOUNTER — Encounter: Payer: Self-pay | Admitting: General Surgery

## 2016-06-09 ENCOUNTER — Ambulatory Visit (INDEPENDENT_AMBULATORY_CARE_PROVIDER_SITE_OTHER): Payer: Medicaid Other | Admitting: General Surgery

## 2016-06-09 ENCOUNTER — Encounter: Payer: Medicaid Other | Admitting: General Surgery

## 2016-06-09 VITALS — BP 128/81 | HR 66 | Temp 97.5°F | Ht 62.0 in | Wt 191.8 lb

## 2016-06-09 DIAGNOSIS — Z4889 Encounter for other specified surgical aftercare: Secondary | ICD-10-CM

## 2016-06-09 NOTE — Patient Instructions (Signed)
Please call our office if you have questions or concerns.   

## 2016-06-09 NOTE — Progress Notes (Signed)
Outpatient Surgical Follow Up  06/09/2016  Allison Flynn is an 35 y.o. female.   Chief Complaint  Patient presents with  . Routine Post Op    laparoscopy Cholecystectomy-Dr.Anaysia Germer-05/29/16    HPI: 35 year old female returns to clinic for follow-up from a laparoscopic cholecystectomy approximately 2 weeks ago. Patient's postoperative course was complicated by abdominal pain. He reports the emergency department where she was found to have a small bout of pancreatitis that responded well to nonoperative therapies and gradually improved. Since being discharged home from that states she's had 0 abdominal pain, nausea, vomiting, diarrhea, constipation. She states she's been very happy with results since that time and is feeling much better. The pain she was having before surgery has remained completely gone since that time. She desires to return to work.  Past Medical History  Diagnosis Date  . Scoliosis 1995  . Vaginal Pap smear, abnormal   . GERD (gastroesophageal reflux disease)     Past Surgical History  Procedure Laterality Date  . No past surgeries    . Tubal ligation N/A 03/21/2016    Procedure: POST PARTUM TUBAL LIGATION;  Surgeon: Will Bonnet, MD;  Location: [facility] ORS;  Service: Gynecology;  Laterality: N/A;  . Cholecystectomy  05/29/2016    Procedure: LAPAROSCOPIC CHOLECYSTECTOMY;  Surgeon: Clayburn Pert, MD;  Location: [facility] ORS;  Service: General;;    Family History  Problem Relation Age of Onset  . Heart disease Maternal Grandmother   . Deep vein thrombosis Maternal Grandmother   . Anemia Sister     Social History:  reports that she quit smoking about 10 months ago. She has never used smokeless tobacco. She reports that she does not drink alcohol or use illicit drugs.  Allergies: No Known Allergies  Medications reviewed.    ROS A multipoint review of systems was completed and all pertinent positives and negatives are documented within the history of  present illness and remainder are negative.   BP 128/81 mmHg  Pulse 66  Temp(Src) 97.5 F (36.4 C) (Oral)  Ht 5\' 2"  (1.575 m)  Wt 87 kg (191 lb 12.8 oz)  BMI 35.07 kg/m2  LMP 05/29/2016  Physical Exam Gen.: No acute distress Chest: Clear to auscultation Heart: Regular rate and rhythm Abdomen: Soft, nontender, nondistended. Well approximately laparoscopic cholecystectomy incision sites without evidence of erythema or drainage.    No results found for this or any previous visit (from the past 48 hour(s)). No results found.  Assessment/Plan:  1. Aftercare following surgery 35 year old female status post laparoscopic cholecystectomy and recent admission for pancreatitis. Discussed that the most likely cause of her readmission was of a retained stone within her common duct. Given her complete resolution of symptoms is likely that she passed that stone. Discussed that it should not be possible to happen again. The signs and symptoms of infection or recurrence were discussed in detail and for her to return to clinic medially should they occur. Otherwise provided with standard postoperative precautions for wound care and returning to activities. She voiced understanding and will follow-up in clinic on an as-needed basis.     Clayburn Pert, MD FACS General Surgeon  06/09/2016,1:46 PM

## 2016-10-13 ENCOUNTER — Other Ambulatory Visit: Payer: Self-pay | Admitting: Ophthalmology

## 2016-10-13 DIAGNOSIS — H4710 Unspecified papilledema: Secondary | ICD-10-CM

## 2016-10-19 DIAGNOSIS — H4710 Unspecified papilledema: Secondary | ICD-10-CM | POA: Insufficient documentation

## 2016-10-19 DIAGNOSIS — G44229 Chronic tension-type headache, not intractable: Secondary | ICD-10-CM | POA: Insufficient documentation

## 2016-10-25 ENCOUNTER — Ambulatory Visit
Admission: RE | Admit: 2016-10-25 | Discharge: 2016-10-25 | Disposition: A | Discharge status: Home / Self Care | Payer: Medicaid Other | Source: Ambulatory Visit | Attending: Ophthalmology | Admitting: Ophthalmology

## 2016-10-25 ENCOUNTER — Ambulatory Visit: Payer: Medicaid Other

## 2016-10-25 DIAGNOSIS — H4710 Unspecified papilledema: Secondary | ICD-10-CM

## 2016-10-25 MED ORDER — GADOBENATE DIMEGLUMINE 529 MG/ML IV SOLN
20.0000 mL | Freq: Once | INTRAVENOUS | Status: AC | PRN
  Administered 2016-10-25: 17 mL via INTRAVENOUS

## 2016-11-09 ENCOUNTER — Other Ambulatory Visit: Payer: Self-pay | Admitting: Neurology

## 2016-11-09 DIAGNOSIS — G932 Benign intracranial hypertension: Secondary | ICD-10-CM

## 2016-11-20 ENCOUNTER — Ambulatory Visit
Admission: RE | Admit: 2016-11-20 | Discharge: 2016-11-20 | Disposition: A | Discharge status: Home / Self Care | Payer: Medicaid Other | Source: Ambulatory Visit | Attending: Neurology | Admitting: Neurology

## 2016-11-20 DIAGNOSIS — G932 Benign intracranial hypertension: Secondary | ICD-10-CM | POA: Diagnosis present

## 2016-11-20 DIAGNOSIS — Z3202 Encounter for pregnancy test, result negative: Secondary | ICD-10-CM | POA: Diagnosis not present

## 2016-11-20 LAB — PREGNANCY, URINE: PREG TEST UR: NEGATIVE

## 2016-12-11 ENCOUNTER — Observation Stay
Admission: EM | Admit: 2016-12-11 | Discharge: 2016-12-12 | Disposition: A | Discharge status: Home / Self Care | Payer: Medicaid Other | Attending: Obstetrics & Gynecology | Admitting: Obstetrics & Gynecology

## 2016-12-11 ENCOUNTER — Encounter: Payer: Self-pay | Admitting: *Deleted

## 2016-12-11 ENCOUNTER — Emergency Department: Payer: Medicaid Other

## 2016-12-11 DIAGNOSIS — N921 Excessive and frequent menstruation with irregular cycle: Principal | ICD-10-CM | POA: Diagnosis present

## 2016-12-11 DIAGNOSIS — D649 Anemia, unspecified: Secondary | ICD-10-CM | POA: Insufficient documentation

## 2016-12-11 DIAGNOSIS — R000 Tachycardia, unspecified: Secondary | ICD-10-CM | POA: Diagnosis not present

## 2016-12-11 DIAGNOSIS — Z87891 Personal history of nicotine dependence: Secondary | ICD-10-CM | POA: Diagnosis not present

## 2016-12-11 DIAGNOSIS — N939 Abnormal uterine and vaginal bleeding, unspecified: Secondary | ICD-10-CM | POA: Diagnosis present

## 2016-12-11 DIAGNOSIS — N938 Other specified abnormal uterine and vaginal bleeding: Secondary | ICD-10-CM

## 2016-12-11 LAB — CBC WITH DIFFERENTIAL/PLATELET
Basophils Absolute: 0 10*3/uL (ref 0–0.1)
Basophils Relative: 0 %
EOS ABS: 0.2 10*3/uL (ref 0–0.7)
Eosinophils Relative: 3 %
HCT: 20.1 % — ABNORMAL LOW (ref 35.0–47.0)
HEMOGLOBIN: 6.7 g/dL — AB (ref 12.0–16.0)
LYMPHS ABS: 2.6 10*3/uL (ref 1.0–3.6)
LYMPHS PCT: 39 %
MCH: 29.7 pg (ref 26.0–34.0)
MCHC: 33.5 g/dL (ref 32.0–36.0)
MCV: 88.6 fL (ref 80.0–100.0)
MONOS PCT: 5 %
Monocytes Absolute: 0.3 10*3/uL (ref 0.2–0.9)
NEUTROS PCT: 53 %
Neutro Abs: 3.5 10*3/uL (ref 1.4–6.5)
Platelets: 291 10*3/uL (ref 150–440)
RBC: 2.27 MIL/uL — ABNORMAL LOW (ref 3.80–5.20)
RDW: 14 % (ref 11.5–14.5)
WBC: 6.6 10*3/uL (ref 3.6–11.0)

## 2016-12-11 LAB — URINALYSIS, COMPLETE (UACMP) WITH MICROSCOPIC
BILIRUBIN URINE: NEGATIVE
Bacteria, UA: NONE SEEN
GLUCOSE, UA: NEGATIVE mg/dL
KETONES UR: NEGATIVE mg/dL
LEUKOCYTES UA: NEGATIVE
NITRITE: NEGATIVE
PH: 9 — AB (ref 5.0–8.0)
Protein, ur: 30 mg/dL — AB
Specific Gravity, Urine: 1.005 (ref 1.005–1.030)

## 2016-12-11 LAB — COMPREHENSIVE METABOLIC PANEL
ALK PHOS: 48 U/L (ref 38–126)
ALT: 14 U/L (ref 14–54)
ANION GAP: 3 — AB (ref 5–15)
AST: 21 U/L (ref 15–41)
Albumin: 4.1 g/dL (ref 3.5–5.0)
BILIRUBIN TOTAL: 0.6 mg/dL (ref 0.3–1.2)
BUN: 10 mg/dL (ref 6–20)
CALCIUM: 9.3 mg/dL (ref 8.9–10.3)
CO2: 28 mmol/L (ref 22–32)
CREATININE: 0.64 mg/dL (ref 0.44–1.00)
Chloride: 109 mmol/L (ref 101–111)
Glucose, Bld: 93 mg/dL (ref 65–99)
Potassium: 3.2 mmol/L — ABNORMAL LOW (ref 3.5–5.1)
SODIUM: 140 mmol/L (ref 135–145)
TOTAL PROTEIN: 7 g/dL (ref 6.5–8.1)

## 2016-12-11 LAB — POCT PREGNANCY, URINE: Preg Test, Ur: NEGATIVE

## 2016-12-11 LAB — PREPARE RBC (CROSSMATCH)

## 2016-12-11 MED ORDER — ALUM & MAG HYDROXIDE-SIMETH 200-200-20 MG/5ML PO SUSP: 30.0000 mL | ORAL | Status: DC | PRN

## 2016-12-11 MED ORDER — LACTATED RINGERS IV SOLN
INTRAVENOUS | Status: DC
  Administered 2016-12-12: 05:00:00 via INTRAVENOUS

## 2016-12-11 MED ORDER — SODIUM CHLORIDE 0.9 % IV BOLUS (SEPSIS)
1000.0000 mL | Freq: Once | INTRAVENOUS | Status: AC
  Administered 2016-12-11: 1000 mL via INTRAVENOUS

## 2016-12-11 MED ORDER — ONDANSETRON HCL 4 MG PO TABS: 4.0000 mg | ORAL_TABLET | Freq: Four times a day (QID) | ORAL | Status: DC | PRN

## 2016-12-11 MED ORDER — MEDROXYPROGESTERONE ACETATE 10 MG PO TABS
20.0000 mg | ORAL_TABLET | Freq: Every day | ORAL | Status: DC
  Administered 2016-12-12 (×2): 20 mg via ORAL
  Filled 2016-12-11 (×2): qty 2

## 2016-12-11 MED ORDER — FUROSEMIDE 10 MG/ML IJ SOLN
20.0000 mg | Freq: Once | INTRAMUSCULAR | Status: AC
  Administered 2016-12-12: 20 mg via INTRAVENOUS
  Filled 2016-12-11: qty 2

## 2016-12-11 MED ORDER — ACETAMINOPHEN 325 MG PO TABS
650.0000 mg | ORAL_TABLET | Freq: Once | ORAL | Status: AC
  Administered 2016-12-12: 650 mg via ORAL
  Filled 2016-12-11: qty 2

## 2016-12-11 MED ORDER — ONDANSETRON HCL 4 MG/2ML IJ SOLN: 4.0000 mg | Freq: Four times a day (QID) | INTRAMUSCULAR | Status: DC | PRN

## 2016-12-11 MED ORDER — ZOLPIDEM TARTRATE 5 MG PO TABS: 5.0000 mg | ORAL_TABLET | Freq: Every evening | ORAL | Status: DC | PRN

## 2016-12-11 MED ORDER — SODIUM CHLORIDE 0.9 % IV SOLN: 10.0000 mL/h | Freq: Once | INTRAVENOUS | Status: DC

## 2016-12-11 MED ORDER — ACETAMINOPHEN 325 MG PO TABS: 650.0000 mg | ORAL_TABLET | ORAL | Status: DC | PRN

## 2016-12-11 MED ORDER — SODIUM CHLORIDE 0.9 % IV SOLN
Freq: Once | INTRAVENOUS | Status: AC
  Administered 2016-12-12: 01:00:00 via INTRAVENOUS

## 2016-12-11 MED ORDER — OXYCODONE-ACETAMINOPHEN 5-325 MG PO TABS
1.0000 | ORAL_TABLET | ORAL | Status: DC | PRN
Start: 2016-12-11 — End: 2016-12-12

## 2016-12-11 MED ORDER — PRENATAL MULTIVITAMIN CH
1.0000 | ORAL_TABLET | Freq: Every day | ORAL | Status: DC
  Administered 2016-12-12: 1 via ORAL
  Filled 2016-12-11: qty 1

## 2016-12-11 MED ORDER — DIPHENHYDRAMINE HCL 25 MG PO CAPS
25.0000 mg | ORAL_CAPSULE | Freq: Once | ORAL | Status: AC
  Administered 2016-12-12: 25 mg via ORAL
  Filled 2016-12-11: qty 1

## 2016-12-11 NOTE — ED Notes (Signed)
Admitting md at bedside

## 2016-12-11 NOTE — ED Provider Notes (Signed)
[facility] Emergency Department Provider Note  ____________________________________________  Time seen: Approximately 10:30 PM  I have reviewed the triage vital signs and the nursing notes.   HISTORY  Chief Complaint Vaginal Bleeding    HPI Allison Flynn is a 36 y.o. female 8 months postpartum presenting with vaginal bleeding, generalized weakness. The patient has had daily vaginal bleeding since Thanksgiving. The bleeding has become heavier, and she is now thoroughly soaking 6 padsper hour, passing clots. Over the last several days, she has become pale, generally weak, with decreased exercise tolerance.   Past Medical History:  Diagnosis Date  . GERD (gastroesophageal reflux disease)   . Scoliosis 1995  . Vaginal Pap smear, abnormal     Patient Active Problem List   Diagnosis Date Noted  . Pancreatitis due to common bile duct stone 06/05/2016  . Biliary colic   . Cholelithiasis 05/16/2016  . Postpartum care following vaginal delivery 03/20/2016  . Idiopathic scoliosis 09/20/2015    Past Surgical History:  Procedure Laterality Date  . CHOLECYSTECTOMY  05/29/2016   Procedure: LAPAROSCOPIC CHOLECYSTECTOMY;  Surgeon: Clayburn Pert, MD;  Location: [facility] ORS;  Service: General;;  . NO PAST SURGERIES    . TUBAL LIGATION N/A 03/21/2016   Procedure: POST PARTUM TUBAL LIGATION;  Surgeon: Will Bonnet, MD;  Location: [facility] ORS;  Service: Gynecology;  Laterality: N/A;    Current Outpatient Rx  . Order #: [id number] Class: Historical Med  . Order #: [id number] Class: Historical Med    Allergies Taxol [paclitaxel]  Family History  Problem Relation Age of Onset  . Heart disease Maternal Grandmother   . Deep vein thrombosis Maternal Grandmother   . Anemia Sister     Social History Social History  Substance Use Topics  . Smoking status: Former Smoker    Quit date: 07/23/2015  . Smokeless tobacco: Never Used  . Alcohol use No    Review  of Systems Constitutional: No fever/chills. Positive generalized weakness per a positive lightheadedness. Negative syncope. Eyes: No visual changes. ENT: No sore throat. No congestion or rhinorrhea. Cardiovascular: Denies chest pain. Denies palpitations. Respiratory: Positive shortness of breath.  No cough. Gastrointestinal: No abdominal pain.  No nausea, no vomiting.  No diarrhea.  No constipation. Genitourinary: Negative for dysuria. Positive progressively worsening vaginal bleeding. Musculoskeletal: Negative for back pain. Skin: Negative for rash. Neurological: Negative for headaches. No focal numbness, tingling or weakness.   10-point ROS otherwise negative.  ____________________________________________   PHYSICAL EXAM:  VITAL SIGNS: ED Triage Vitals  Enc Vitals Group     BP 12/11/16 1841 (!) 150/68     Pulse Rate 12/11/16 1841 100     Resp 12/11/16 1841 20     Temp 12/11/16 1841 97.8 F (36.6 C)     Temp Source 12/11/16 1841 Oral     SpO2 12/11/16 1841 100 %     Weight 12/11/16 1842 180 lb (81.6 kg)     Height 12/11/16 1842 5\' 2"  (1.575 m)     Head Circumference --      Peak Flow --      Pain Score --      Pain Loc --      Pain Edu? --      Excl. in [place]? --     Constitutional: Alert and oriented. Uncomfortable appearing but nontoxic.Marland Kitchen Answers questions appropriately. Eyes: Conjunctivae are pale.  EOMI. No scleral icterus. Head: Atraumatic. Nose: No congestion/rhinnorhea. Mouth/Throat: Mucous membranes are moist.  Neck: No stridor.  Supple.  Cardiovascular: Fast rate, regular rhythm. No murmurs, rubs or gallops.  Respiratory: Normal respiratory effort.  No accessory muscle use or retractions. Lungs CTAB.  No wheezes, rales or ronchi. Gastrointestinal: Soft, nontender and nondistended.  No guarding or rebound.  No peritoneal signs. Genitourinary: Deferred Musculoskeletal: No LE edema.  Neurologic:  A&Ox3.  Speech is clear.  Face and smile are symmetric.  EOMI.   Moves all extremities well. Skin:  Skin is warm, dry and intact. No rash noted. Positive pallor. Psychiatric: Mood and affect are normal. Speech and behavior are normal.  Normal judgement.  ____________________________________________   LABS (all labs ordered are listed, but only abnormal results are displayed)  Labs Reviewed  CBC WITH DIFFERENTIAL/PLATELET - Abnormal; Notable for the following:       Result Value   RBC 2.27 (*)    Hemoglobin 6.7 (*)    HCT 20.1 (*)    All other components within normal limits  COMPREHENSIVE METABOLIC PANEL - Abnormal; Notable for the following:    Potassium 3.2 (*)    Anion gap 3 (*)    All other components within normal limits  URINALYSIS, COMPLETE (UACMP) WITH MICROSCOPIC - Abnormal; Notable for the following:    Color, Urine YELLOW (*)    APPearance HAZY (*)    pH 9.0 (*)    Hgb urine dipstick LARGE (*)    Protein, ur 30 (*)    Squamous Epithelial / LPF 0-5 (*)    All other components within normal limits  POC URINE PREG, ED  POCT PREGNANCY, URINE  TYPE AND SCREEN  PREPARE RBC (CROSSMATCH)   ____________________________________________  EKG  Not indicated ____________________________________________  RADIOLOGY  US Transvaginal Non-ob  Result Date: 12/11/2016 CLINICAL DATA:  Heavy vaginal bleeding for 2 months EXAM: TRANSABDOMINAL AND TRANSVAGINAL ULTRASOUND OF PELVIS TECHNIQUE: Both transabdominal and transvaginal ultrasound examinations of the pelvis were performed. Transabdominal technique was performed for global imaging of the pelvis including uterus, ovaries, adnexal regions, and pelvic cul-de-sac. It was necessary to proceed with endovaginal exam following the transabdominal exam to visualize the endometrium and ovaries. COMPARISON:  None FINDINGS: Uterus Measurements: 9.8 x 5.6 x 6.5 cm. No fibroids or other mass visualized. Small nabothian cysts. Endometrium Thickness: 13 mm.  No focal abnormality visualized. Right ovary  Measurements: 3.6 x 2.5 x 3.2 cm. Normal appearance/no adnexal mass. Left ovary Measurements: 3.8 x 2 x 2.1 cm. Normal appearance/no adnexal mass. Other findings No abnormal free fluid. IMPRESSION: Negative pelvic ultrasound Electronically Signed   By: Donavan Foil M.D.   On: 12/11/2016 23:02   US Pelvis Complete  Result Date: 12/11/2016 CLINICAL DATA:  Heavy vaginal bleeding for 2 months EXAM: TRANSABDOMINAL AND TRANSVAGINAL ULTRASOUND OF PELVIS TECHNIQUE: Both transabdominal and transvaginal ultrasound examinations of the pelvis were performed. Transabdominal technique was performed for global imaging of the pelvis including uterus, ovaries, adnexal regions, and pelvic cul-de-sac. It was necessary to proceed with endovaginal exam following the transabdominal exam to visualize the endometrium and ovaries. COMPARISON:  None FINDINGS: Uterus Measurements: 9.8 x 5.6 x 6.5 cm. No fibroids or other mass visualized. Small nabothian cysts. Endometrium Thickness: 13 mm.  No focal abnormality visualized. Right ovary Measurements: 3.6 x 2.5 x 3.2 cm. Normal appearance/no adnexal mass. Left ovary Measurements: 3.8 x 2 x 2.1 cm. Normal appearance/no adnexal mass. Other findings No abnormal free fluid. IMPRESSION: Negative pelvic ultrasound Electronically Signed   By: Donavan Foil M.D.   On: 12/11/2016 23:02    ____________________________________________   PROCEDURES  Procedure(s) performed: None  Procedures  Critical Care performed: Yes ____________________________________________   INITIAL IMPRESSION / ASSESSMENT AND PLAN / ED COURSE  Pertinent labs & imaging results that were available during my care of the patient were reviewed by me and considered in my medical decision making (see chart for details).  36 y.o. female presenting with presently worsening vaginal bleeding, now with signs and symptoms of anemia. The patient's laboratory studies from triage show a market drop in her hemoglobin and  hematocrit, 6.7/20.1 from12.1/33.9 on 06/07/16. I have talked to the patient about transfusion and admission.  CRITICAL CARE Performed by: Eula Listen   Total critical care time: 35 minutes  Critical care time was exclusive of separately billable procedures and treating other patients.  Critical care was necessary to treat or prevent imminent or life-threatening deterioration.  Critical care was time spent personally by me on the following activities: development of treatment plan with patient and/or surrogate as well as nursing, discussions with consultants, evaluation of patient's response to treatment, examination of patient, obtaining history from patient or surrogate, ordering and performing treatments and interventions, ordering and review of laboratory studies, ordering and review of radiographic studies, pulse oximetry and re-evaluation of patient's condition.  ----------------------------------------- 11:14 PM on 12/11/2016 -----------------------------------------  Blood transfusion has been ordered. The patient continues to be comfortable and her sinus tachycardia has resolved. The patient has been admitted to the OB/GYN service by Dr. Kenton Kingfisher. ____________________________________________  FINAL CLINICAL IMPRESSION(S) / ED DIAGNOSES  Final diagnoses:  Symptomatic anemia  Dysfunctional uterine bleeding  Sinus tachycardia    Clinical Course       NEW MEDICATIONS STARTED DURING THIS VISIT:  New Prescriptions   No medications on file      Eula Listen, MD 12/11/16 2314

## 2016-12-11 NOTE — ED Notes (Signed)
Pt transported to US at this time. 

## 2016-12-11 NOTE — H&P (Signed)
Obstetrics & Gynecology H&P Note  Date of Consultation: 12/11/2016   Requesting Provider: [facility]  Primary OBGYN: [facility]  Primary Care Provider: [facility] Department  Reason for Consultation: Bleeding and dizziness  History of Present Illness: Allison Flynn is a 36 y.o. [phone] (Patient's last menstrual period was 12/11/2016.), with the above CC. Pt has h/o NSVD in APril followed by [facility] BTL, has been breast feeding and first period was in November.  She is now having vaginal bleeding, generalized weakness. The patient has had daily vaginal bleeding since Thanksgiving. The bleeding has become heavier, and she is now thoroughly soaking 6 padsper hour, passing clots. Over the last several days, she has become pale, generally weak, with decreased exercise tolerance.  ROS: A 12-point review of systems was performed and negative, except as stated in the above HPI.  OBGYN History: As per HPI. 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  No obvious congenital anomalies. 2014 - LGA infant.      Past Medical History: Past Medical History:  Diagnosis Date  . GERD (gastroesophageal reflux disease)   . Scoliosis 1995  . Vaginal Pap smear, abnormal     Past Surgical History: Past Surgical History:  Procedure Laterality Date  . CHOLECYSTECTOMY  05/29/2016   Procedure: LAPAROSCOPIC CHOLECYSTECTOMY;  Surgeon: Clayburn Pert, MD;  Location: [facility] ORS;  Service: General;;  . NO PAST SURGERIES    . TUBAL LIGATION N/A 03/21/2016   Procedure: POST PARTUM TUBAL LIGATION;  Surgeon: Will Bonnet, MD;  Location: [facility] ORS;  Service: Gynecology;  Laterality: N/A;    Family History:  Family History  Problem Relation Age of Onset  .  Heart disease Maternal Grandmother   . Deep vein thrombosis Maternal Grandmother   . Anemia Sister    She denies any female cancers, bleeding or blood clotting disorders.   Social History:  Social History   Social History  . Marital status: Married    Spouse name: N/A  . Number of children: N/A  . Years of education: N/A   Occupational History  . Not on file.   Social History Main Topics  . Smoking status: Former Smoker    Quit date: 07/23/2015  . Smokeless tobacco: Never Used  . Alcohol use No  . Drug use: No  . Sexual activity: Yes   Other Topics Concern  . Not on file   Social History Narrative  . No narrative on file   Health Maintenance:   Allergy: Allergies  Allergen Reactions  . Taxol [Paclitaxel] Other (See Comments)    Entered by mistake    Current Outpatient Medications:  (Not in a hospital admission)   Hospital Medications: Current Facility-Administered Medications  Medication Dose Route Frequency Provider Last Rate Last Dose  . [START ON 12/12/2016] 0.9 %  sodium chloride infusion   Intravenous Once Gae Dry, MD      . acetaminophen (TYLENOL) tablet 650 mg  650 mg Oral Q4H PRN Gae Dry, MD      . Derrill Memo ON 12/12/2016] acetaminophen (TYLENOL) tablet 650 mg  650 mg Oral Once [employer]  Ijanae Macapagal, MD      . alum & mag hydroxide-simeth (MAALOX/MYLANTA) 200-200-20 MG/5ML suspension 30 mL  30 mL Oral Q4H PRN Gae Dry, MD      . Derrill Memo ON 12/12/2016] diphenhydrAMINE (BENADRYL) capsule 25 mg  25 mg Oral Once Gae Dry, MD      . Derrill Memo ON 12/12/2016] furosemide (LASIX) injection 20 mg  20 mg Intravenous Once Gae Dry, MD      . Derrill Memo ON 12/12/2016] lactated ringers infusion   Intravenous Continuous Gae Dry, MD      . Derrill Memo ON 12/12/2016] medroxyPROGESTERone (PROVERA) tablet 20 mg  20 mg Oral Daily Gae Dry, MD      . ondansetron [facility]) tablet 4 mg  4 mg Oral Q6H PRN Gae Dry, MD       Or  . ondansetron [facility])  injection 4 mg  4 mg Intravenous Q6H PRN Gae Dry, MD      . oxyCODONE-acetaminophen (PERCOCET/ROXICET) 5-325 MG per tablet 1-2 tablet  1-2 tablet Oral Q3H PRN Gae Dry, MD      . Derrill Memo ON 12/12/2016] prenatal multivitamin tablet 1 tablet  1 tablet Oral Q1200 Gae Dry, MD      . zolpidem (AMBIEN) tablet 5 mg  5 mg Oral QHS PRN Gae Dry, MD       Current Outpatient Prescriptions  Medication Sig Dispense Refill  . acetaZOLAMIDE (DIAMOX) 250 MG tablet Take 250 mg by mouth 3 (three) times daily.    Marland Kitchen gabapentin (NEURONTIN) 300 MG capsule Take 300 mg by mouth at bedtime.     Physical Exam: Vitals:   12/11/16 1841 12/11/16 1842 12/11/16 2310 12/11/16 2311  BP: (!) 150/68  121/60   Pulse: 100   86  Resp: 20     Temp: 97.8 F (36.6 C)     TempSrc: Oral     SpO2: 100%   100%  Weight:  180 lb (81.6 kg)    Height:  5\' 2"  (1.575 m)     Body mass index is 32.92 kg/m. General appearance: Well nourished, well developed female in no acute distress.  Neck:  Supple, normal appearance, and no thyromegaly  Cardiovascular:Regular rate and rhythm.  No murmurs, rubs or gallops. Respiratory:  Clear to auscultation bilateral. Normal respiratory effort Abdomen: positive bowel sounds and no masses, hernias; diffusely non tender to palpation, non distended Neuro/Psych:  Normal mood and affect.  Skin:  Warm and dry.  Lymphatic:  No inguinal lymphadenopathy.    Recent Labs Lab 12/11/16 1847  NA 140  K 3.2*  CL 109  CO2 28  BUN 10  CREATININE 0.64  CALCIUM 9.3  PROT 7.0  BILITOT 0.6  ALKPHOS 48  ALT 14  AST 21  GLUCOSE 93   Hgb 6.7  Imaging:   Normal Ultrasound  Assessment: [name] is a 36 y.o. [id number] (Patient's last menstrual period was 12/11/2016.) who presented to the ED with complaints of bleeding and signs of anemia; findings are consistent with blood loss anemia and menometrorrhagia.  Plan: Provera 20 mg daily for 10 days Transfusion of 2  units Outpatient mgt after transfusion Options such as hormonal tx, ablation, and hysterectomy discussed as long term mgt choices.  Barnett Applebaum, MD [facility] OBGYN Pager [phone]

## 2016-12-11 NOTE — ED Triage Notes (Signed)
Pt has vaginal bleeding since thanksgiving.  Pt reports heavy bleeding with clots.  Bleeding eased off and began again around christmas.  No abd pain.  No urinary sx.

## 2016-12-12 LAB — CBC
HEMATOCRIT: 23.4 % — AB (ref 35.0–47.0)
HEMOGLOBIN: 7.9 g/dL — AB (ref 12.0–16.0)
MCH: 28.8 pg (ref 26.0–34.0)
MCHC: 33.7 g/dL (ref 32.0–36.0)
MCV: 85.5 fL (ref 80.0–100.0)
Platelets: 230 10*3/uL (ref 150–440)
RBC: 2.73 MIL/uL — AB (ref 3.80–5.20)
RDW: 15.6 % — ABNORMAL HIGH (ref 11.5–14.5)
WBC: 5.5 10*3/uL (ref 3.6–11.0)

## 2016-12-12 MED ORDER — MEDROXYPROGESTERONE ACETATE 10 MG PO TABS: 20.0000 mg | ORAL_TABLET | Freq: Every day | ORAL | 0 refills | Status: DC

## 2016-12-12 NOTE — Progress Notes (Signed)
Discharge order received from doctor.  Reviewed discharge instructions and prescriptions with patient and answered all questions. Follow up appointment given. Patient verbalized understanding. Patient discharged home via wheelchair by nursing/auxillary.    Jery Hollern Garner, RN  

## 2016-12-12 NOTE — Discharge Summary (Signed)
Physician Discharge Summary  Patient ID: Allison Flynn MRN: [id number] DOB/AGE: [birth year] 34 y.o.  Admit date: 12/11/2016 Discharge date: 12/12/2016  Admission Diagnoses: Menometrorrhagia, Anemia  Discharge Diagnoses:  Active Problems:   Menometrorrhagia   Anemia  Discharged Condition: good  Hospital Course: Pt seen and examined, see H&P.  Long recent course of bleeding to symptomatic anemia.  Prior BTL, no recent hormones.  Stopped breast feeding a few mos ago.  Consults: None  Significant Diagnostic Studies: labs: Hgb 6.7 then 7.9 after transfusion of 2 U pRMCs; and Provera therapy to short term slow down bleeding.  Normal Ultrasound.  Treatments: IV hydration and transfusion  Discharge Exam: Blood pressure (!) 97/54, pulse 79, temperature 98 F (36.7 C), temperature source Oral, resp. rate 20, height 5\' 2"  (1.575 m), weight 180 lb (81.6 kg), last menstrual period 12/11/2016, SpO2 100 %, not currently breastfeeding. General appearance: alert, cooperative, appears older than stated age, cachectic, combative, cyanotic, delirious, distracted, fatigued, flushed, icteric and no distress  Disposition: 01-Home or Self Care   Allergies as of 12/12/2016      Reactions   Taxol [paclitaxel] Other (See Comments)   Entered by mistake      Medication List    TAKE these medications   acetaZOLAMIDE 250 MG tablet Commonly known as:  DIAMOX Take 250 mg by mouth 3 (three) times daily.   gabapentin 300 MG capsule Commonly known as:  NEURONTIN Take 300 mg by mouth at bedtime.   medroxyPROGESTERone 10 MG tablet Commonly known as:  PROVERA Take 2 tablets (20 mg total) by mouth daily.      Follow-up Information    Hoyt Koch, MD. Schedule an appointment as soon as possible for a visit in 10 day(s).   Specialty:  Obstetrics and Gynecology Why:  anemia and bleeding check Contact information: [address] [place] [address] [phone]            Signed: Hoyt Koch 12/12/2016, 8:11 AM

## 2016-12-12 NOTE — Discharge Instructions (Signed)
Menorrhagia Menorrhagia is when your menstrual periods are heavy or last longer than usual. Follow these instructions at home:  Only take medicine as told by your doctor.  Take any iron pills as told by your doctor. Heavy bleeding may cause low levels of iron in your body.  Do not take aspirin 1 week before or during your period. Aspirin can make the bleeding worse.  Lie down for a while if you change your tampon or pad more than once in 2 hours. This may help lessen the bleeding.  Eat a healthy diet and foods with iron. These foods include leafy green vegetables, meat, liver, eggs, and whole grain breads and cereals.  Do not try to lose weight. Wait until the heavy bleeding has stopped and your iron level is normal. Contact a doctor if:  You soak through a pad or tampon every 1 or 2 hours, and this happens every time you have a period.  You need to use pads and tampons at the same time because you are bleeding so much.  You need to change your pad or tampon during the night.  You have a period that lasts for more than 8 days.  You pass clots bigger than 1 inch (2.5 cm) wide.  You have irregular periods that happen more or less often than once a month.  You feel dizzy or pass out (faint).  You feel very weak or tired.  You feel short of breath or feel your heart is beating too fast when you exercise.  You feel sick to your stomach (nausea) and you throw up (vomit) while you are taking your medicine.  You have watery poop (diarrhea) while you are taking your medicine.  You have any problems that may be related to the medicine you are taking. Get help right away if:  You soak through 4 or more pads or tampons in 2 hours.  You have any bleeding while you are pregnant. This information is not intended to replace advice given to you by your health care provider. Make sure you discuss any questions you have with your health care provider. Document Released: 08/29/2008 Document  Revised: 04/27/2016 Document Reviewed: 05/22/2013 Elsevier Interactive Patient Education  2017 [employer]  

## 2016-12-13 LAB — TYPE AND SCREEN
BLOOD PRODUCT EXPIRATION DATE: 201801182359
Blood Product Expiration Date: 201801182359
ISSUE DATE / TIME: 201801090043
ISSUE DATE / TIME: 201801090249
Unit Type and Rh: 600
Unit Type and Rh: 600

## 2016-12-25 ENCOUNTER — Encounter
Admission: RE | Admit: 2016-12-25 | Discharge: 2016-12-25 | Disposition: A | Discharge status: Home / Self Care | Payer: Medicaid Other | Source: Ambulatory Visit | Attending: Obstetrics & Gynecology | Admitting: Obstetrics & Gynecology

## 2016-12-25 HISTORY — DX: Hypokalemia: E87.6

## 2016-12-25 HISTORY — DX: Anemia, unspecified: D64.9

## 2016-12-25 HISTORY — DX: Unspecified papilledema: H47.10

## 2016-12-25 HISTORY — DX: Cardiac arrhythmia, unspecified: I49.9

## 2016-12-25 HISTORY — DX: Anxiety disorder, unspecified: F41.9

## 2016-12-25 NOTE — Pre-Procedure Instructions (Signed)
Vickki Hearing, MD - 11/09/2016 11:15 AM EST Formatting of this note may be different from the original. Today the history is gathered from: 100% - patient  0% - patient alone today  REFERRING PHYSICIAN: Vickki Hearing, MD PRIMARY CARE PHYSICIAN: Gale Journey, MD  CHIEF COMPLAIN & HISTORY OF PRESENT ILLNESS:  Ms. Scurry is a 36 y.o. female presenting for evaluation of: Chief Complaint  Patient presents with  . Headache   PAPILLEDEMA/ HEADACHE Patient states she has only had minor headaches. Occur a couple times a week. Last about an hour. Taking Diamox 250 mg two times daily. States she has had tingling in her fingers and her feet. Patient had brain MRI. No LP yet. States when she tries to read she has blurred vision. States she has tingling in her hands and feet since starting Diamox. States this causes a lot of pain in her feet at night.  Medications previously tried (reason stopped):  IMPRESSION/PLAN  Ms. Hegwood is a 36 y.o. female presenting for evaluation of  PAPILLEDEMA/ HEADACHE -Patient with recent diagnosis of papilledema and headache. -States she continues to have minor headaches a couple of times per week. Has blurred vision when reading. This happens often. Patient has an appointment with her eye doctor next week. 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 -Rec increasing Diamox to 250 mg three times daily. -Rec starting gabapentin 300 mg at night. -Rec patient wait until about a month after her LP to see her eye doctor. -MRI brain - Reviewed and discussed with patient and within normal limits.  MEDICATIONS Outpatient Prescriptions Marked as Taking for the 11/09/16 encounter (Office Visit) with Vickki Hearing, MD  Medication Sig  . acetaZOLAMIDE (DIAMOX) 250 MG tablet Take 1 tablet (250 mg total) by mouth 3 (three) times daily.  . [DISCONTINUED] acetaZOLAMIDE (DIAMOX) 250 MG tablet Take 1 tablet (250 mg total) by mouth 2  (two) times daily.   ALLERGIES No Known Allergies  EXAM   Vitals:  11/09/16 1116  BP: (!) 136/94  Pulse: 80  Weight: 89.2 kg (196 lb 9.6 oz)  Height: 157.5 cm (5\' 2" )  PainSc: 0-No pain   Body mass index is 35.96 kg/m.  GENERAL: Pleasant female in no apparent distress. Normocephalic and atraumatic.  MUSCULOSKELETAL: Bulk - Obese Tone - Normal Pronator Drift - Absent bilaterally. Ambulation - Gait and station are within normal limits. Romberg - Negative.  R/L 5/5 Shoulder abduction (deltoid/supraspinatus, axillary/suprascapular n, C5) 5/5 Elbow flexion (biceps brachii, musculoskeletal n, C5-6) 5/5 Elbow extension (triceps, radial n, C7) 5/5 Finger adduction (interossei, ulnar n, T1)  5/5 Hip flexion (iliopsoas, L1/L2) 5/5 Knee flexion (hamstrings, sciatic n, L5/S1)  5/5 Knee extension (quadriceps, femoral n, L3/4) 5/5 Ankle dorsiflexion (tibialis anterior, deep fibular n, L4/5) 5/5 Ankle plantarflexion (gastroc, tibial n, S1)   NEUROLOGICAL: MENTAL STATUS: Patient is oriented to person, place and time.  Recent memory is intact.  Remote memory is intact.  Attention span and concentration are intact.  Naming and repetition are intact. Comprehension is intact.  Expressive speech is intact.  Patient's fund of knowledge is normal for educational level.  CRANIAL NERVES: Visual acuity and visual fields are intact  Extraocular muscles are intact  Facial sensation is intact bilaterally  Facial strength is intact bilaterally  Hearing is intact bilaterally  Palate elevates midline, normal phonation  Shoulder shrug strength is intact  Tongue protrudes midline   SENSATION: Pain and temperature (spinothalamic tracts) is normal. Position and vibration (dorsal columns)  is normal.  REFLEXES: R/L 2+/2+ Biceps 2+/2+ Brachioradialis  2+/2+ Patellar 2+/2+ Achilles  COORDINATION/CEREBELLAR: Finger to nose testing is normal.   PAST MEDICAL HISTORY Past Medical  History:  Diagnosis Date  . Scoliosis   PAST SURGICAL HISTORY Past Surgical History:  Procedure Laterality Date  . LAPAROSCOPIC CHOLECYSTECTOMY  . LAPAROSCOPIC TUBAL LIGATION   FAMILY HISTORY Family History  Problem Relation Age of Onset  . No Known Problems Father  . Myocardial Infarction (Heart attack) Maternal Grandfather   SOCIAL HISTORY  Social History  Substance Use Topics  . Smoking status: Former Research scientist (life sciences)  . Smokeless tobacco: Never Used  . Alcohol use No   REVIEW OF SYSTEMS:  More than 13 system ROS form was given to the patient to complete and I have reviewed it. The form was sent for scan to the patient's EHR. Pertinent positives are mentioned above in the HPI.  DATA  I have personally reviewed all of the data outlined below both prior to the appointment and during the appointment with the patient as appropriate.  IMAGING 10/25/2016 Optic nerve edema. Visual disturbance for several months.  EXAM: MRI HEAD WITHOUT AND WITH CONTRAST  TECHNIQUE: Multiplanar, multiecho pulse sequences of the brain and surrounding structures were obtained without and with intravenous contrast.  CONTRAST:63mL MULTIHANCE GADOBENATE DIMEGLUMINE 529 MG/ML IV SOLN  COMPARISON:None.  FINDINGS: Brain: There is no evidence of acute infarct, intracranial hemorrhage, mass, midline shift, or extra-axial fluid collection. The ventricles and sulci are normal. The brain is normal in signal. No abnormal enhancement is identified. The pituitary gland is unremarkable. The cerebellar tonsils are normally positioned.  Vascular: Major intracranial vascular flow voids are preserved.  Skull and upper cervical spine: No focal marrow lesion.  Sinuses/Orbits: Unremarkable appearance of the orbits on this nondedicated study. No significant inflammatory sinus disease.  Other: None.  IMPRESSION: Unremarkable brain MRI.  No results found for any previous visit.   No Follow-up on  file.  Payor: MEDICAID [place] / Plan: MEDICAID [place] ACCESS / Product Type: Medicaid /   This note is partially written by Gertie Exon, CMA in the presence of and acting as the scribe of Dr. Gurney Maxin, who has reviewed, edited and added to the note to reflect his best personal medical judgment.  Dr. Doneta Public. Melrose Nakayama, MD Board Certified in Neurology  Board Certified in [place]. [place], [place] [address] Phone: [phone] Fax: [phone]

## 2016-12-25 NOTE — Patient Instructions (Signed)
  Your procedure is scheduled on: 01-02-17 (TUESDAY) Report to Same Day Surgery 2nd floor medical mall [facility] Entrance-take elevator on left to 2nd floor.  Check in with surgery information desk.) To find out your arrival time please call [phone] between 1PM - 3PM on 01-01-17 [facility])  Remember: Instructions that are not followed completely may result in serious medical risk, up to and including death, or upon the discretion of your surgeon and anesthesiologist your surgery may need to be rescheduled.    _x___ 1. Do not eat food or drink liquids after midnight. No gum chewing or hard candies.     __x__ 2. No Alcohol for 24 hours before or after surgery.   __x__3. No Smoking for 24 prior to surgery.   ____  4. Bring all medications with you on the day of surgery if instructed.    __x__ 5. Notify your doctor if there is any change in your medical condition     (cold, fever, infections).     Do not wear jewelry, make-up, hairpins, clips or nail polish.  Do not wear lotions, powders, or perfumes. You may wear deodorant.  Do not shave 48 hours prior to surgery. Men may shave face and neck.  Do not bring valuables to the hospital.    [facility] is not responsible for any belongings or valuables.               Contacts, dentures or bridgework may not be worn into surgery.  Leave your suitcase in the car. After surgery it may be brought to your room.  For patients admitted to the hospital, discharge time is determined by your treatment team.   Patients discharged the day of surgery will not be allowed to drive home.  You will need someone to drive you home and stay with you the night of your procedure.    Please read over the following fact sheets that you were given:   [facility] Preparing for Surgery and or MRSA Information   ____ Take these medicines the morning of surgery with A SIP OF WATER:    1. NONE  2.  3.  4.  5.  6.  ____Fleets enema or Magnesium Citrate as  directed.   ____ Use CHG Soap or sage wipes as directed on instruction sheet   ____ Use inhalers on the day of surgery and bring to hospital day of surgery  ____ Stop metformin 2 days prior to surgery    ____ Take 1/2 of usual insulin dose the night before surgery and none on the morning of  surgery.   ____ Stop Aspirin, Coumadin, Pllavix ,Eliquis, Effient, or Pradaxa  x__ Stop Anti-inflammatories such as Advil, Aleve, Ibuprofen, Motrin, Naproxen,          Naprosyn, Goodies powders or aspirin products NOW-Ok to take Tylenol.   ____ Stop supplements until after surgery.    ____ Bring C-Pap to the hospital.

## 2016-12-26 ENCOUNTER — Encounter
Admission: RE | Admit: 2016-12-26 | Discharge: 2016-12-26 | Disposition: A | Discharge status: Home / Self Care | Payer: Medicaid Other | Source: Ambulatory Visit | Attending: Obstetrics & Gynecology | Admitting: Obstetrics & Gynecology

## 2016-12-26 DIAGNOSIS — N921 Excessive and frequent menstruation with irregular cycle: Secondary | ICD-10-CM | POA: Insufficient documentation

## 2016-12-26 DIAGNOSIS — D649 Anemia, unspecified: Secondary | ICD-10-CM | POA: Insufficient documentation

## 2016-12-26 LAB — POTASSIUM: POTASSIUM: 3.4 mmol/L — AB (ref 3.5–5.1)

## 2016-12-26 LAB — CBC
HEMATOCRIT: 26.3 % — AB (ref 35.0–47.0)
HEMOGLOBIN: 8.5 g/dL — AB (ref 12.0–16.0)
MCH: 26 pg (ref 26.0–34.0)
MCHC: 32.4 g/dL (ref 32.0–36.0)
MCV: 80.1 fL (ref 80.0–100.0)
Platelets: 326 10*3/uL (ref 150–440)
RBC: 3.29 MIL/uL — AB (ref 3.80–5.20)
RDW: 15.5 % — ABNORMAL HIGH (ref 11.5–14.5)
WBC: 6.4 10*3/uL (ref 3.6–11.0)

## 2016-12-26 LAB — TYPE AND SCREEN
ABO/RH(D): A NEG
ANTIBODY SCREEN: NEGATIVE
EXTEND SAMPLE REASON: TRANSFUSED

## 2017-01-02 ENCOUNTER — Encounter
Admission: RE | Disposition: A | Discharge status: Home / Self Care | Payer: Self-pay | Source: Ambulatory Visit | Attending: Obstetrics & Gynecology

## 2017-01-02 ENCOUNTER — Ambulatory Visit
Admission: RE | Admit: 2017-01-02 | Discharge: 2017-01-02 | Disposition: A | Discharge status: Home / Self Care | Payer: Medicaid Other | Source: Ambulatory Visit | Attending: Obstetrics & Gynecology | Admitting: Obstetrics & Gynecology

## 2017-01-02 ENCOUNTER — Ambulatory Visit: Payer: Medicaid Other | Admitting: Anesthesiology

## 2017-01-02 ENCOUNTER — Encounter: Payer: Self-pay | Admitting: *Deleted

## 2017-01-02 DIAGNOSIS — F419 Anxiety disorder, unspecified: Secondary | ICD-10-CM | POA: Diagnosis not present

## 2017-01-02 DIAGNOSIS — Z87891 Personal history of nicotine dependence: Secondary | ICD-10-CM | POA: Diagnosis not present

## 2017-01-02 DIAGNOSIS — N921 Excessive and frequent menstruation with irregular cycle: Secondary | ICD-10-CM | POA: Diagnosis present

## 2017-01-02 DIAGNOSIS — N920 Excessive and frequent menstruation with regular cycle: Secondary | ICD-10-CM | POA: Insufficient documentation

## 2017-01-02 DIAGNOSIS — D649 Anemia, unspecified: Secondary | ICD-10-CM | POA: Diagnosis not present

## 2017-01-02 DIAGNOSIS — K219 Gastro-esophageal reflux disease without esophagitis: Secondary | ICD-10-CM | POA: Diagnosis not present

## 2017-01-02 HISTORY — PX: DILITATION & CURRETTAGE/HYSTROSCOPY WITH NOVASURE ABLATION: SHX5568

## 2017-01-02 LAB — TYPE AND SCREEN
ABO/RH(D): A NEG
Antibody Screen: NEGATIVE

## 2017-01-02 LAB — POCT PREGNANCY, URINE: PREG TEST UR: NEGATIVE

## 2017-01-02 SURGERY — DILATATION & CURETTAGE/HYSTEROSCOPY WITH NOVASURE ABLATION
Anesthesia: General

## 2017-01-02 MED ORDER — FAMOTIDINE 20 MG PO TABS
20.0000 mg | ORAL_TABLET | Freq: Once | ORAL | Status: AC
  Administered 2017-01-02: 20 mg via ORAL

## 2017-01-02 MED ORDER — PROPOFOL 10 MG/ML IV BOLUS
INTRAVENOUS | Status: AC
  Filled 2017-01-02: qty 20

## 2017-01-02 MED ORDER — OXYCODONE HCL 5 MG/5ML PO SOLN: 5.0000 mg | Freq: Once | ORAL | Status: DC | PRN

## 2017-01-02 MED ORDER — FENTANYL CITRATE (PF) 100 MCG/2ML IJ SOLN
25.0000 ug | INTRAMUSCULAR | Status: DC | PRN
  Administered 2017-01-02: 50 ug via INTRAVENOUS

## 2017-01-02 MED ORDER — LACTATED RINGERS IV SOLN
INTRAVENOUS | Status: DC
  Administered 2017-01-02: 09:00:00 via INTRAVENOUS

## 2017-01-02 MED ORDER — FENTANYL CITRATE (PF) 100 MCG/2ML IJ SOLN
INTRAMUSCULAR | Status: DC | PRN
  Administered 2017-01-02 (×2): 50 ug via INTRAVENOUS

## 2017-01-02 MED ORDER — PHENYLEPHRINE HCL 10 MG/ML IJ SOLN
INTRAMUSCULAR | Status: AC
  Filled 2017-01-02: qty 1

## 2017-01-02 MED ORDER — DEXMEDETOMIDINE HCL IN NACL 200 MCG/50ML IV SOLN
INTRAVENOUS | Status: DC | PRN
  Administered 2017-01-02: 8 ug via INTRAVENOUS

## 2017-01-02 MED ORDER — FENTANYL CITRATE (PF) 100 MCG/2ML IJ SOLN
INTRAMUSCULAR | Status: AC
  Filled 2017-01-02: qty 2

## 2017-01-02 MED ORDER — ONDANSETRON HCL 4 MG/2ML IJ SOLN
INTRAMUSCULAR | Status: DC | PRN
  Administered 2017-01-02: 4 mg via INTRAVENOUS

## 2017-01-02 MED ORDER — KETOROLAC TROMETHAMINE 30 MG/ML IJ SOLN
INTRAMUSCULAR | Status: AC
  Filled 2017-01-02: qty 1

## 2017-01-02 MED ORDER — LIDOCAINE HCL (CARDIAC) 20 MG/ML IV SOLN
INTRAVENOUS | Status: DC | PRN
  Administered 2017-01-02: 100 mg via INTRAVENOUS

## 2017-01-02 MED ORDER — MIDAZOLAM HCL 2 MG/2ML IJ SOLN
INTRAMUSCULAR | Status: AC
  Filled 2017-01-02: qty 2

## 2017-01-02 MED ORDER — DEXAMETHASONE SODIUM PHOSPHATE 10 MG/ML IJ SOLN
INTRAMUSCULAR | Status: DC | PRN
  Administered 2017-01-02: 10 mg via INTRAVENOUS

## 2017-01-02 MED ORDER — OXYCODONE-ACETAMINOPHEN 5-325 MG PO TABS: 1.0000 | ORAL_TABLET | ORAL | 0 refills | Status: DC | PRN

## 2017-01-02 MED ORDER — ONDANSETRON HCL 4 MG/2ML IJ SOLN
INTRAMUSCULAR | Status: AC
  Filled 2017-01-02: qty 2

## 2017-01-02 MED ORDER — LIDOCAINE HCL (PF) 2 % IJ SOLN
INTRAMUSCULAR | Status: AC
  Filled 2017-01-02: qty 2

## 2017-01-02 MED ORDER — DEXAMETHASONE SODIUM PHOSPHATE 10 MG/ML IJ SOLN
INTRAMUSCULAR | Status: AC
  Filled 2017-01-02: qty 1

## 2017-01-02 MED ORDER — MIDAZOLAM HCL 2 MG/2ML IJ SOLN
INTRAMUSCULAR | Status: DC | PRN
  Administered 2017-01-02: 2 mg via INTRAVENOUS

## 2017-01-02 MED ORDER — FAMOTIDINE 20 MG PO TABS
ORAL_TABLET | ORAL | Status: AC
  Filled 2017-01-02: qty 1

## 2017-01-02 MED ORDER — HYDROMORPHONE HCL 1 MG/ML IJ SOLN
INTRAMUSCULAR | Status: AC
  Filled 2017-01-02: qty 1

## 2017-01-02 MED ORDER — OXYCODONE HCL 5 MG PO TABS: 5.0000 mg | ORAL_TABLET | Freq: Once | ORAL | Status: DC | PRN

## 2017-01-02 MED ORDER — PROPOFOL 10 MG/ML IV BOLUS
INTRAVENOUS | Status: DC | PRN
  Administered 2017-01-02: 180 mg via INTRAVENOUS

## 2017-01-02 SURGICAL SUPPLY — 16 items
CANISTER SUCT 3000ML (MISCELLANEOUS) ×3 IMPLANT
CATH ROBINSON RED A/P 16FR (CATHETERS) ×3 IMPLANT
GLOVE BIO SURGEON STRL SZ8 (GLOVE) ×3 IMPLANT
GOWN STRL REUS W/ TWL LRG LVL3 (GOWN DISPOSABLE) ×1 IMPLANT
GOWN STRL REUS W/ TWL XL LVL3 (GOWN DISPOSABLE) ×1 IMPLANT
GOWN STRL REUS W/TWL LRG LVL3 (GOWN DISPOSABLE) ×2
GOWN STRL REUS W/TWL XL LVL3 (GOWN DISPOSABLE) ×2
IV LACTATED RINGERS 1000ML (IV SOLUTION) ×3 IMPLANT
NOVASURE ENDOMETRIAL ABLATION (MISCELLANEOUS) ×3 IMPLANT
NS IRRIG 500ML POUR BTL (IV SOLUTION) ×3 IMPLANT
PACK DNC HYST (MISCELLANEOUS) ×3 IMPLANT
PAD OB MATERNITY 4.3X12.25 (PERSONAL CARE ITEMS) ×3 IMPLANT
PAD PREP 24X41 OB/GYN DISP (PERSONAL CARE ITEMS) ×3 IMPLANT
STRAP SAFETY BODY (MISCELLANEOUS) ×3 IMPLANT
TUBING CONNECTING 10 (TUBING) ×2 IMPLANT
TUBING CONNECTING 10' (TUBING) ×1

## 2017-01-02 NOTE — Anesthesia Post-op Follow-up Note (Cosign Needed)
Anesthesia QCDR form completed.        

## 2017-01-02 NOTE — Anesthesia Postprocedure Evaluation (Signed)
Anesthesia Post Note  Patient: Allison Flynn  Procedure(s) Performed: Procedure(s) (LRB): DILATATION & CURETTAGE/HYSTEROSCOPY WITH NOVASURE ABLATION (N/A)  Patient location during evaluation: PACU Anesthesia Type: General Level of consciousness: awake and alert Pain management: pain level controlled Vital Signs Assessment: post-procedure vital signs reviewed and stable Respiratory status: spontaneous breathing, nonlabored ventilation, respiratory function stable and patient connected to nasal cannula oxygen Cardiovascular status: blood pressure returned to baseline and stable Postop Assessment: no signs of nausea or vomiting Anesthetic complications: no     Last Vitals:  Vitals:   01/02/17 1055 01/02/17 1100  BP:  (!) 107/58  Pulse: 70 73  Resp: 15 14  Temp:  36.6 C    Last Pain:  Vitals:   01/02/17 1100  PainSc: 1                  Kruti Horacek K Terese Heier

## 2017-01-02 NOTE — Discharge Instructions (Signed)
Hysteroscopy, Care After  Refer to this sheet in the next few weeks. These instructions provide you with information on caring for yourself after your procedure. Your health care provider may also give you more specific instructions. Your treatment has been planned according to current medical practices, but problems sometimes occur. Call your health care provider if you have any problems or questions after your procedure.  WHAT TO EXPECT AFTER THE PROCEDURE After your procedure, it is typical to have the following:  You may have some cramping. This normally lasts for a couple days.  You may have bleeding. This can vary from light spotting for a few days to menstrual-like bleeding for 3-7 days. HOME CARE INSTRUCTIONS  Rest for the first 1-2 days after the procedure.  Only take over-the-counter or prescription medicines as directed by your health care provider. Do not take aspirin. It can increase the chances of bleeding.  Take showers instead of baths for 2 weeks or as directed by your health care provider.  Do not drive for 24 hours or as directed.  Do not drink alcohol while taking pain medicine.  Do not use tampons, douche, or have sexual intercourse for 2 weeks or until your health care provider says it is okay.  Take your temperature twice a day for 4-5 days. Write it down each time.  Follow your health care provider's advice about diet, exercise, and lifting.  If you develop constipation, you may:  Take a mild laxative if your health care provider approves.  Add bran foods to your diet.  Drink enough fluids to keep your urine clear or pale yellow.  Try to have someone with you or available to you for the first 24-48 hours, especially if you were given a general anesthetic.  Follow up with your health care provider as directed. SEEK MEDICAL CARE IF:  You feel dizzy or lightheaded.  You feel sick to your stomach (nauseous).  You have abnormal vaginal discharge.  You  have a rash.  You have pain that is not controlled with medicine. SEEK IMMEDIATE MEDICAL CARE IF:  You have bleeding that is heavier than a normal menstrual period.  You have a fever.  You have increasing cramps or pain, not controlled with medicine.  You have new belly (abdominal) pain.  You pass out.  You have pain in the tops of your shoulders (shoulder strap areas).  You have shortness of breath. This information is not intended to replace advice given to you by your health care provider. Make sure you discuss any questions you have with your health care provider. Document Released: 09/10/2013 Document Reviewed: 09/10/2013 Elsevier Interactive Patient Education  2017 [place]   1) The drugs that you were given will stay in your system until tomorrow so for the next 24 hours you should not:  A) Drive an automobile B) Make any legal decisions C) Drink any alcoholic beverage   2) You may resume regular meals tomorrow.  Today it is better to start with liquids and gradually work up to solid foods.  You may eat anything you prefer, but it is better to start with liquids, then soup and crackers, and gradually work up to solid foods.   3) Please notify your doctor immediately if you have any unusual bleeding, trouble breathing, redness and pain at the surgery site, drainage, fever, or pain not relieved by medication.    4) Additional Instructions:  Additional Instructions: ° ° ° ° ° ° ° °Please contact your physician with any problems or Same Day Surgery at [phone], Monday through Friday 6 am to 4 pm, or [place] [facility] [place] Main number at [phone]. ° ° ° ° °

## 2017-01-02 NOTE — Anesthesia Preprocedure Evaluation (Signed)
Anesthesia Evaluation  Patient identified by MRN, date of birth, ID band Patient awake    Reviewed: Allergy & Precautions, H&P , NPO status , Patient's Chart, lab work & pertinent test results  History of Anesthesia Complications Negative for: history of anesthetic complications  Airway Mallampati: III  TM Distance: <3 FB Neck ROM: full    Dental  (+) Poor Dentition   Pulmonary neg shortness of breath, former smoker,    Pulmonary exam normal breath sounds clear to auscultation       Cardiovascular Exercise Tolerance: Good (-) angina(-) Past MI and (-) DOE negative cardio ROS Normal cardiovascular exam+ dysrhythmias  Rhythm:regular Rate:Normal     Neuro/Psych PSYCHIATRIC DISORDERS Anxiety negative neurological ROS     GI/Hepatic Neg liver ROS, GERD  ,  Endo/Other  negative endocrine ROS  Renal/GU negative Renal ROS  negative genitourinary   Musculoskeletal   Abdominal   Peds  Hematology  (+) Blood dyscrasia, anemia ,   Anesthesia Other Findings Past Medical History:   Scoliosis                                       1995         Vaginal Pap smear, abnormal                                  GERD (gastroesophageal reflux disease)                      Past Surgical History:   NO PAST SURGERIES                                             TUBAL LIGATION                                  N/A 03/21/2016      Comment:Procedure: POST PARTUM TUBAL LIGATION;                Surgeon: Will Bonnet, MD;  Location: [facility]              ORS;  Service: Gynecology;  Laterality: N/A;     Reproductive/Obstetrics negative OB ROS                             Anesthesia Physical  Anesthesia Plan  ASA: III  Anesthesia Plan: General LMA   Post-op Pain Management:    Induction:   Airway Management Planned:   Additional Equipment:   Intra-op Plan:   Post-operative Plan:   Informed Consent: I  have reviewed the patients History and Physical, chart, labs and discussed the procedure including the risks, benefits and alternatives for the proposed anesthesia with the patient or authorized representative who has indicated his/her understanding and acceptance.   Dental Advisory Given  Plan Discussed with: Anesthesiologist, CRNA and Surgeon  Anesthesia Plan Comments:         Anesthesia Quick Evaluation

## 2017-01-02 NOTE — Op Note (Signed)
Operative Note   01/02/2017  PRE-OP DIAGNOSIS: Menorrhagia, Anemia   POST-OP DIAGNOSIS: same   SURGEON: Barnett Applebaum, MD, FACOG   PROCEDURE: Procedure(s): DILATATION & CURETTAGE/HYSTEROSCOPY WITH NOVASURE ABLATION  ANESTHESIA: General   ESTIMATED BLOOD LOSS: min   SPECIMENS: ECC, EMC   FLUID DEFICIT: min   COMPLICATIONS: none   DISPOSITION: PACU - hemodynamically stable.   CONDITION: stable   FINDINGS: Exam under anesthesia revealed small, mobile small uterus with no masses and bilateral adnexa without masses or fullness. Hysteroscopy revealed otherwise grossly normal appearing uterine cavity with bilateral tubal ostia and normal appearing endocervical canal.   PROCEDURE IN DETAIL: After informed consent was obtained, the patient was taken to the operating room where anesthesia was obtained without difficulty. The patient was positioned in the dorsal lithotomy position in [place]. The patient's bladder was catheterized with an in and out foley catheter. The patient was examined under anesthesia, with the above noted findings. The weightedspeculum was placed inside the patient's vagina, and the the anterior lip of the cervix was seen and grasped with the tenaculum. An Endocervical specimen was obtained with a kevorkian curette. The uterine cavity was sounded to 9cm, and then the cervix was progressively dilated to a 16 French-Pratt dilator. The 30 degree hysteroscope was introduced, with LR fluid used to distend the intrauterine cavity, with the above noted findings.   The hystersocope was removed and the uterine cavity was curetted until a gritty texture was noted, yielding endometrial curettings.  Excellent hemostasis was noted.   The NovaSure device was then placed without difficulty. Measurements were obtained. Patient was noted to have a uterine length of 6 cm, a cervical length of 3 cm, and a cervical width of 4.6 cm. The NovaSure device is first tested and after  confirmation the procedures performed. Length of procedure was 81 seconds. The NovaSure device is then removed and repeat hysteroscopy reveals an appropriate lining of the uterus and no perforation or injury. Repeat hysteroscopy performed, with improved contour and lining of uterus noted. Hysteroscope is removed with minimal discrepancy of fluid.   Tenaculum was removed with excellent hemostasis noted. She was then taken out of dorsal lithotomy. Minimal discrepancy in fluid was noted. No bleeding.   The patient tolerated the procedure well. Sponge, lap and needle counts were correct x2. The patient was taken to recovery room in excellent condition.

## 2017-01-02 NOTE — H&P (Signed)
History and Physical Interval Note:  01/02/2017 8:41 AM  Allison Flynn  has presented today for surgery, with the diagnosis of MENOMETRORRHAGIA,ANEMIA  The various methods of treatment have been discussed with the patient and family. After consideration of risks, benefits and other options for treatment, the patient has consented to  Procedure(s): [place] (N/A) as a surgical intervention .  The patient's history has been reviewed, patient examined, no change in status, stable for surgery.  Pt has the following beta blocker history-  Not taking Beta Blocker.  I have reviewed the patient's chart and labs.  Questions were answered to the patient's satisfaction.       Hoyt Koch

## 2017-01-02 NOTE — Op Note (Signed)
Operative Note   01/02/2017  PRE-OP DIAGNOSIS: Menorrhagia, Anemia   POST-OP DIAGNOSIS: same   SURGEON: Barnett Applebaum, MD, FACOG   PROCEDURE: Procedure(s): DILATATION & CURETTAGE/HYSTEROSCOPY WITH NOVASURE ABLATION  ANESTHESIA: General   ESTIMATED BLOOD LOSS: min   SPECIMENS: ECC, EMC   FLUID DEFICIT: min   COMPLICATIONS: none   DISPOSITION: PACU - hemodynamically stable.   CONDITION: stable   FINDINGS: Exam under anesthesia revealed small, mobile small uterus with no masses and bilateral adnexa without masses or fullness. Hysteroscopy revealed otherwise grossly normal appearing uterine cavity with bilateral tubal ostia and normal appearing endocervical canal.   PROCEDURE IN DETAIL: After informed consent was obtained, the patient was taken to the operating room where anesthesia was obtained without difficulty. The patient was positioned in the dorsal lithotomy position in [place]. The patient's bladder was catheterized with an in and out foley catheter. The patient was examined under anesthesia, with the above noted findings. The weightedspeculum was placed inside the patient's vagina, and the the anterior lip of the cervix was seen and grasped with the tenaculum. An Endocervical specimen was obtained with a kevorkian curette. The uterine cavity was sounded to 9cm, and then the cervix was progressively dilated to a 16 French-Pratt dilator. The 30 degree hysteroscope was introduced, with LR fluid used to distend the intrauterine cavity, with the above noted findings.   The hystersocope was removed and the uterine cavity was curetted until a gritty texture was noted, yielding endometrial curettings.  Excellent hemostasis was noted.   The NovaSure device was then placed without difficulty. Measurements were obtained. Patient was noted to have a uterine cavity length of 6 cm, a cervical length of 3 cm, and a cervical width of 4.5 cm. The NovaSure device is first tested and after  confirmation the procedures performed. Length of procedure was 81 seconds. The NovaSure device is then removed and repeat hysteroscopy reveals an appropriate lining of the uterus and no perforation or injury. Repeat hysteroscopy performed, with improved contour and lining of uterus noted. Hysteroscope is removed with minimal discrepancy of fluid.   Tenaculum was removed with excellent hemostasis noted. She was then taken out of dorsal lithotomy. Minimal discrepancy in fluid was noted. No bleeding.   The patient tolerated the procedure well. Sponge, lap and needle counts were correct x2. The patient was taken to recovery room in excellent condition.

## 2017-01-02 NOTE — Progress Notes (Signed)
Operative Note   01/02/2017  PRE-OP DIAGNOSIS: Menorrhagia, Anemia   POST-OP DIAGNOSIS: same   SURGEON: Barnett Applebaum, MD, FACOG   PROCEDURE: Procedure(s): DILATATION & CURETTAGE/HYSTEROSCOPY WITH NOVASURE ABLATION  ANESTHESIA: General   ESTIMATED BLOOD LOSS: min   SPECIMENS: ECC, EMC   FLUID DEFICIT: min   COMPLICATIONS: none   DISPOSITION: PACU - hemodynamically stable.   CONDITION: stable   FINDINGS: Exam under anesthesia revealed small, mobile small uterus with no masses and bilateral adnexa without masses or fullness. Hysteroscopy revealed otherwise grossly normal appearing uterine cavity with bilateral tubal ostia and normal appearing endocervical canal.   PROCEDURE IN DETAIL: After informed consent was obtained, the patient was taken to the operating room where anesthesia was obtained without difficulty. The patient was positioned in the dorsal lithotomy position in [place]. The patient's bladder was catheterized with an in and out foley catheter. The patient was examined under anesthesia, with the above noted findings. The weightedspeculum was placed inside the patient's vagina, and the the anterior lip of the cervix was seen and grasped with the tenaculum. An Endocervical specimen was obtained with a kevorkian curette. The uterine cavity was sounded to 9cm, and then the cervix was progressively dilated to a 16 French-Pratt dilator. The 30 degree hysteroscope was introduced, with LR fluid used to distend the intrauterine cavity, with the above noted findings.   The hystersocope was removed and the uterine cavity was curetted until a gritty texture was noted, yielding endometrial curettings.  Excellent hemostasis was noted.   The NovaSure device was then placed without difficulty. Measurements were obtained. Patient was noted to have a uterine length of 6 cm, a cervical length of 3 cm, and a cervical width of 4.6 cm. The NovaSure device is first tested and after  confirmation the procedures performed. Length of procedure was 81 seconds. The NovaSure device is then removed and repeat hysteroscopy reveals an appropriate lining of the uterus and no perforation or injury. Repeat hysteroscopy performed, with improved contour and lining of uterus noted. Hysteroscope is removed with minimal discrepancy of fluid.   Tenaculum was removed with excellent hemostasis noted. She was then taken out of dorsal lithotomy. Minimal discrepancy in fluid was noted. No bleeding.   The patient tolerated the procedure well. Sponge, lap and needle counts were correct x2. The patient was taken to recovery room in excellent condition.

## 2017-01-02 NOTE — Anesthesia Procedure Notes (Signed)
Procedure Name: LMA Insertion Date/Time: 01/02/2017 9:19 AM Performed by: Justus Memory Pre-anesthesia Checklist: Patient identified, Emergency Drugs available, Suction available and Patient being monitored Patient Re-evaluated:Patient Re-evaluated prior to inductionOxygen Delivery Method: Circle system utilized Preoxygenation: Pre-oxygenation with 100% oxygen Intubation Type: IV induction Ventilation: Mask ventilation without difficulty LMA: LMA inserted LMA Size: 4.5 Placement Confirmation: positive ETCO2 and CO2 detector Dental Injury: Teeth and Oropharynx as per pre-operative assessment

## 2017-01-02 NOTE — Op Note (Signed)
Operative Note   01/02/2017  PRE-OP DIAGNOSIS: Menorrhagia, Anemia   POST-OP DIAGNOSIS: same   SURGEON: Barnett Applebaum, MD, FACOG   PROCEDURE: Procedure(s): DILATATION & CURETTAGE/HYSTEROSCOPY WITH NOVASURE ABLATION  ANESTHESIA: General   ESTIMATED BLOOD LOSS: min   SPECIMENS: ECC, EMC   FLUID DEFICIT: min   COMPLICATIONS: none   DISPOSITION: PACU - hemodynamically stable.   CONDITION: stable   FINDINGS: Exam under anesthesia revealed small, mobile small uterus with no masses and bilateral adnexa without masses or fullness. Hysteroscopy revealed otherwise grossly normal appearing uterine cavity with bilateral tubal ostia and normal appearing endocervical canal.   PROCEDURE IN DETAIL: After informed consent was obtained, the patient was taken to the operating room where anesthesia was obtained without difficulty. The patient was positioned in the dorsal lithotomy position in [place]. The patient's bladder was catheterized with an in and out foley catheter. The patient was examined under anesthesia, with the above noted findings. The weightedspeculum was placed inside the patient's vagina, and the the anterior lip of the cervix was seen and grasped with the tenaculum. An Endocervical specimen was obtained with a kevorkian curette. The uterine cavity was sounded to 9cm, and then the cervix was progressively dilated to a 16 French-Pratt dilator. The 30 degree hysteroscope was introduced, with LR fluid used to distend the intrauterine cavity, with the above noted findings.   The hystersocope was removed and the uterine cavity was curetted until a gritty texture was noted, yielding endometrial curettings.  Excellent hemostasis was noted.   The NovaSure device was then placed without difficulty. Measurements were obtained. Patient was noted to have a uterine length of 6 cm, a cervical length of 3 cm, and a cervical width of 4.6 cm. The NovaSure device is first tested and after  confirmation the procedures performed. Length of procedure was 81 seconds. The NovaSure device is then removed and repeat hysteroscopy reveals an appropriate lining of the uterus and no perforation or injury. Repeat hysteroscopy performed, with improved contour and lining of uterus noted. Hysteroscope is removed with minimal discrepancy of fluid.   Tenaculum was removed with excellent hemostasis noted. She was then taken out of dorsal lithotomy. Minimal discrepancy in fluid was noted. No bleeding.   The patient tolerated the procedure well. Sponge, lap and needle counts were correct x2. The patient was taken to recovery room in excellent condition.

## 2017-01-02 NOTE — Transfer of Care (Signed)
Immediate Anesthesia Transfer of Care Note  Patient: Allison Flynn  Procedure(s) Performed: Procedure(s): DILATATION & CURETTAGE/HYSTEROSCOPY WITH NOVASURE ABLATION (N/A)  Patient Location: PACU  Anesthesia Type:General  Level of Consciousness: sedated  Airway & Oxygen Therapy: Patient Spontanous Breathing and Patient connected to face mask oxygen  Post-op Assessment: Report given to RN and Post -op Vital signs reviewed and stable  Post vital signs: Reviewed and stable  Last Vitals:  Vitals:   01/02/17 0824  BP: 125/62  Pulse: 83  Resp: 16  Temp: (!) 35.7 C    Last Pain:  Vitals:   01/02/17 0824  PainSc: 0-No pain         Complications: No apparent anesthesia complications

## 2017-01-03 LAB — SURGICAL PATHOLOGY

## 2017-01-31 DIAGNOSIS — F4323 Adjustment disorder with mixed anxiety and depressed mood: Secondary | ICD-10-CM | POA: Insufficient documentation

## 2019-09-08 ENCOUNTER — Other Ambulatory Visit: Payer: Self-pay

## 2019-09-08 ENCOUNTER — Ambulatory Visit (LOCAL_COMMUNITY_HEALTH_CENTER): Payer: Medicaid Other

## 2019-09-08 DIAGNOSIS — Z111 Encounter for screening for respiratory tuberculosis: Secondary | ICD-10-CM

## 2019-09-11 ENCOUNTER — Other Ambulatory Visit: Payer: Self-pay

## 2019-09-11 ENCOUNTER — Ambulatory Visit (LOCAL_COMMUNITY_HEALTH_CENTER): Payer: Medicaid Other

## 2019-09-11 DIAGNOSIS — Z111 Encounter for screening for respiratory tuberculosis: Secondary | ICD-10-CM

## 2019-09-11 LAB — TB SKIN TEST
Induration: 0 mm
TB Skin Test: NEGATIVE

## 2019-10-02 ENCOUNTER — Other Ambulatory Visit: Payer: Self-pay

## 2019-10-02 ENCOUNTER — Other Ambulatory Visit: Payer: Medicaid Other

## 2019-10-03 ENCOUNTER — Ambulatory Visit (LOCAL_COMMUNITY_HEALTH_CENTER): Payer: Medicaid Other

## 2019-10-03 DIAGNOSIS — Z111 Encounter for screening for respiratory tuberculosis: Secondary | ICD-10-CM

## 2019-10-03 NOTE — Progress Notes (Signed)
In for 2nd step PPD Debera Lat, RN

## 2019-10-06 ENCOUNTER — Ambulatory Visit (LOCAL_COMMUNITY_HEALTH_CENTER): Payer: Self-pay

## 2019-10-06 ENCOUNTER — Other Ambulatory Visit: Payer: Self-pay

## 2019-10-06 DIAGNOSIS — Z111 Encounter for screening for respiratory tuberculosis: Secondary | ICD-10-CM

## 2019-10-06 LAB — TB SKIN TEST
Induration: 0 mm
TB Skin Test: NEGATIVE

## 2020-12-01 ENCOUNTER — Emergency Department
Admission: EM | Admit: 2020-12-01 | Discharge: 2020-12-01 | Disposition: A | Discharge status: Home / Self Care | Payer: Medicaid Other | Attending: Emergency Medicine | Admitting: Emergency Medicine

## 2020-12-01 ENCOUNTER — Other Ambulatory Visit: Payer: Self-pay

## 2020-12-01 ENCOUNTER — Emergency Department: Payer: Medicaid Other

## 2020-12-01 DIAGNOSIS — Z87891 Personal history of nicotine dependence: Secondary | ICD-10-CM | POA: Insufficient documentation

## 2020-12-01 DIAGNOSIS — Z20822 Contact with and (suspected) exposure to covid-19: Secondary | ICD-10-CM | POA: Insufficient documentation

## 2020-12-01 DIAGNOSIS — J40 Bronchitis, not specified as acute or chronic: Secondary | ICD-10-CM | POA: Insufficient documentation

## 2020-12-01 IMAGING — CR DG CHEST 2V
2 series · 2 of 2 positions shown · non-contrast
Comparison: None.

CLINICAL DATA: Cough, hemoptysis

EXAM:
CHEST - 2 VIEW

[chest pa]
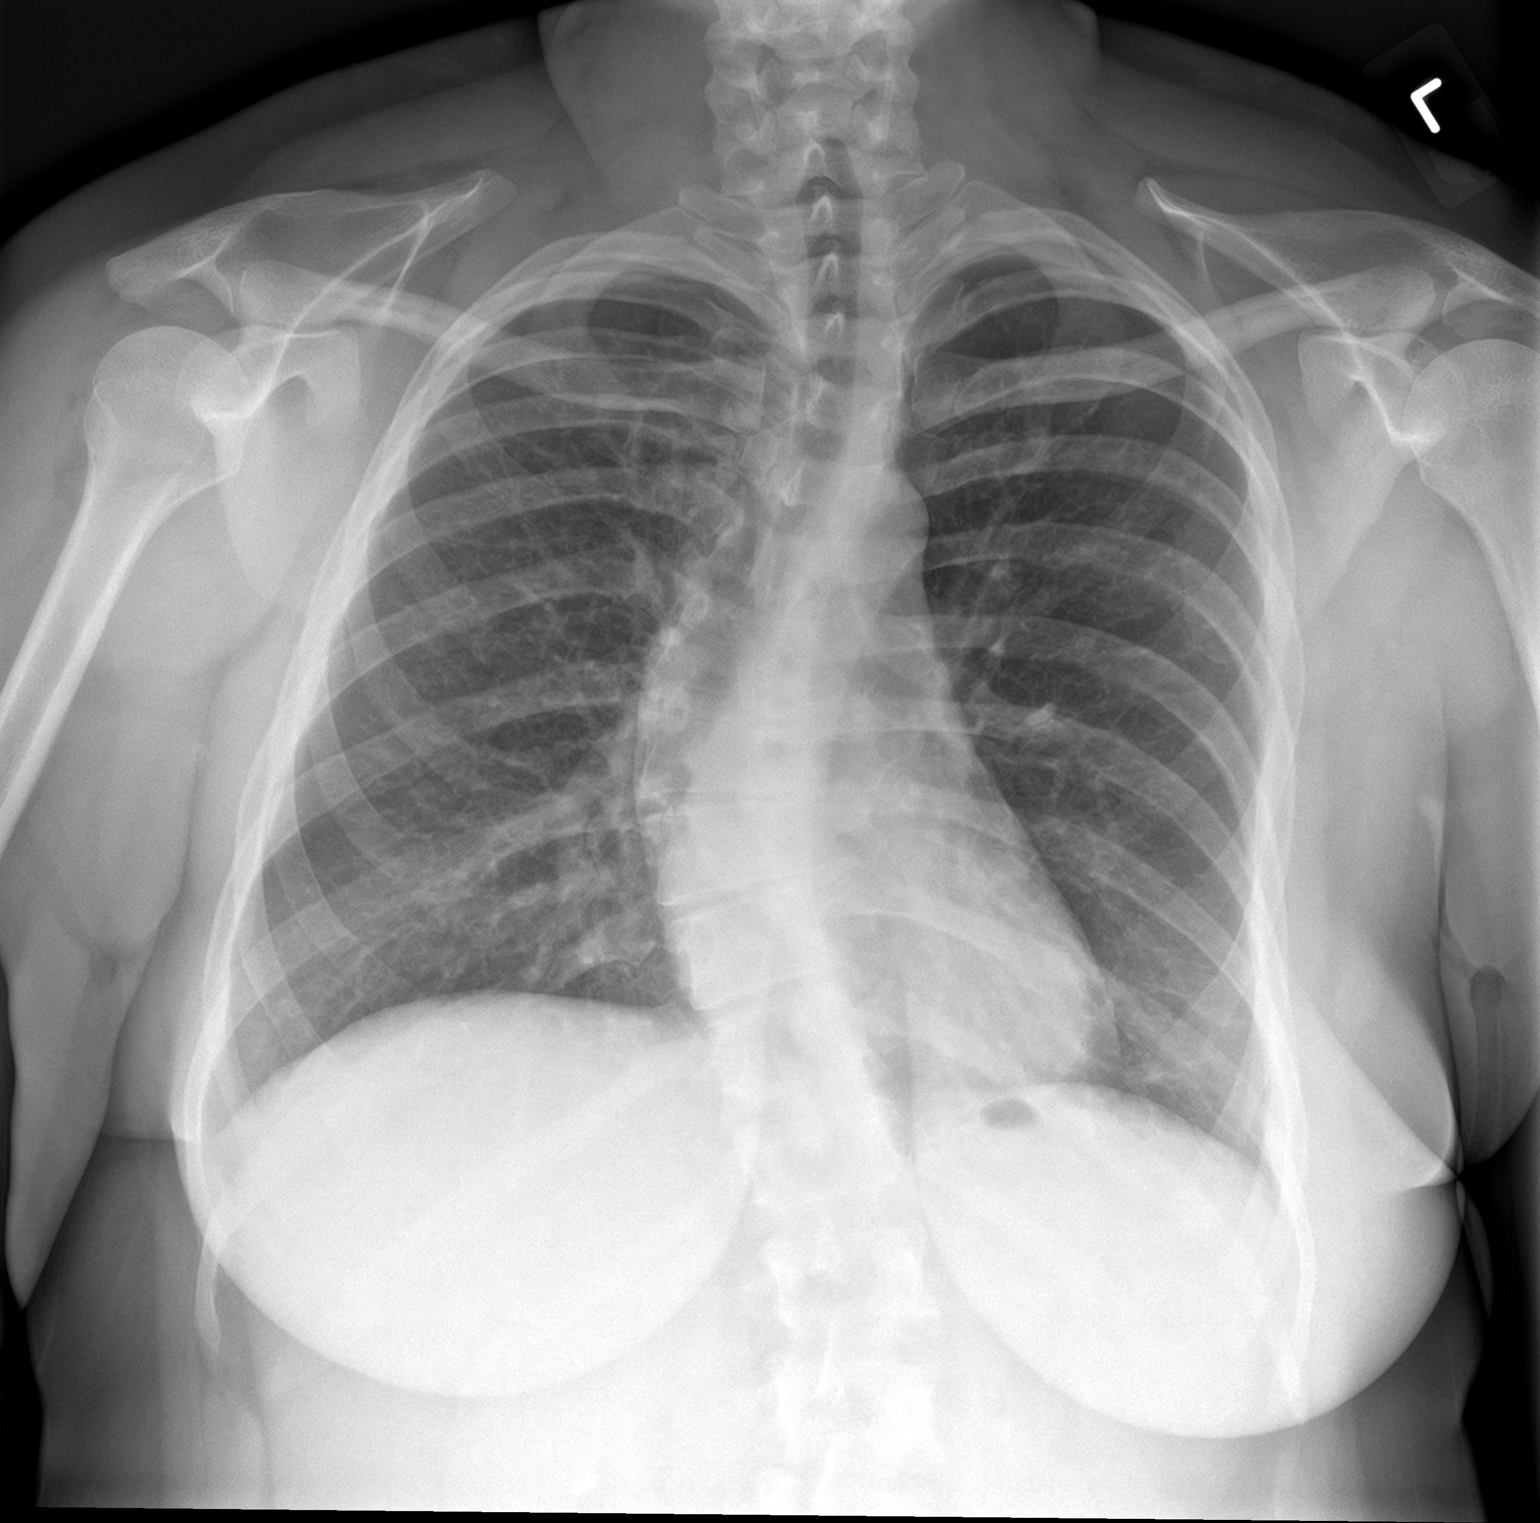

[chest lat]
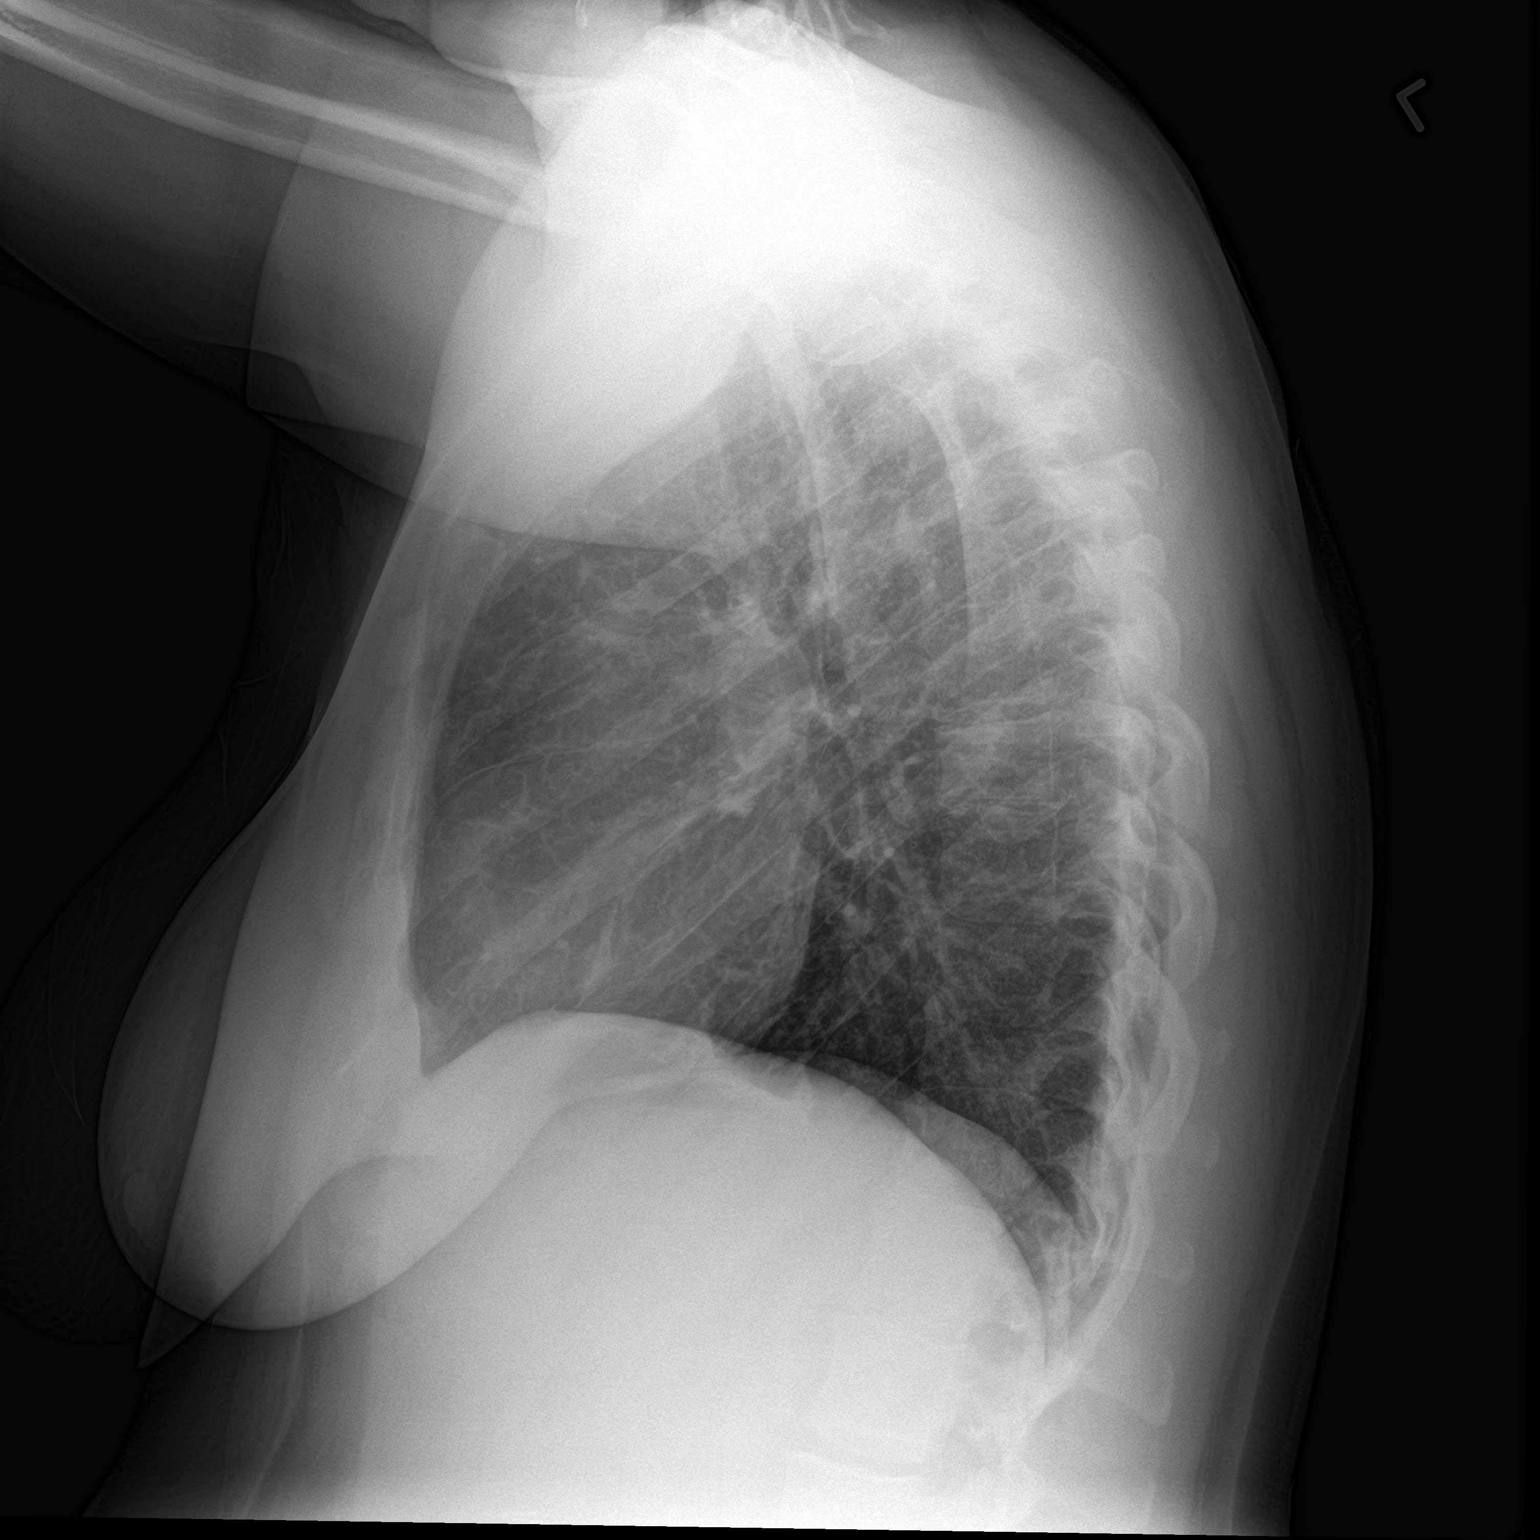

[2 of 2 positions shown; findings below may reference images not displayed]

FINDINGS: Frontal and lateral views of the chest demonstrate an unremarkable
cardiac silhouette. No airspace disease, effusion, or pneumothorax.
Mild bronchovascular prominence could reflect reactive airway
disease or bronchitis. No acute bony abnormalities. Prominent right
convex scoliosis centered in the midthoracic spine.
IMPRESSION: 1. Prominent bronchovascular markings which may reflect bronchitis
or reactive airway disease. No lobar pneumonia.

## 2020-12-01 MED ORDER — ALBUTEROL SULFATE HFA 108 (90 BASE) MCG/ACT IN AERS: 2.0000 | INHALATION_SPRAY | Freq: Four times a day (QID) | RESPIRATORY_TRACT | 0 refills | Status: AC | PRN

## 2020-12-01 MED ORDER — BENZONATATE 100 MG PO CAPS
200.0000 mg | ORAL_CAPSULE | Freq: Once | ORAL | Status: AC
  Administered 2020-12-01: 21:00:00 200 mg via ORAL
  Filled 2020-12-01: qty 2

## 2020-12-01 MED ORDER — PREDNISONE 20 MG PO TABS
60.0000 mg | ORAL_TABLET | Freq: Once | ORAL | Status: AC
  Administered 2020-12-01: 21:00:00 60 mg via ORAL
  Filled 2020-12-01: qty 3

## 2020-12-01 MED ORDER — BENZONATATE 100 MG PO CAPS: ORAL_CAPSULE | ORAL | 0 refills | Status: DC

## 2020-12-01 MED ORDER — PREDNISONE 20 MG PO TABS: 20.0000 mg | ORAL_TABLET | Freq: Two times a day (BID) | ORAL | 0 refills | Status: AC

## 2020-12-01 MED ORDER — AZITHROMYCIN 250 MG PO TABS: ORAL_TABLET | ORAL | 0 refills | Status: DC

## 2020-12-01 NOTE — Discharge Instructions (Addendum)
Take the prescription meds as directed. Follow-up with your provider as needed.

## 2020-12-01 NOTE — ED Triage Notes (Signed)
PT has been feeling unwell Christmas eve, today got to coughing and could not stop. She got some mucous up and it had a little blood in it. PT had another episode of such at work and was sent here. PT also endorses wheezing.

## 2020-12-02 LAB — SARS CORONAVIRUS 2 (TAT 6-24 HRS): SARS Coronavirus 2: NEGATIVE

## 2020-12-02 NOTE — ED Provider Notes (Signed)
[facility] Emergency Department Provider Note ____________________________________________  Time seen: 2002  I have reviewed the triage vital signs and the nursing notes.  HISTORY  Chief Complaint  Cough  HPI Allison Flynn is a 39 y.o. female presents to the ED accompanied by her daughter, for evaluation of 1 week of intermittently productive cough since Christmas eve.  She reports persistent cough that worsened sharply today.  Patient reports a little bit of blood-tinged sputum today.  She gives a history of bronchitis and asthma.  She reached for her rescue inhaler today, but realized it was at a date and ENT.  She denies any interim fevers, chills, sweats.  Patient denies any change in taste or smell sensation.  Past Medical History:  Diagnosis Date  . Anemia   . Anxiety   . Dysrhythmia    IRREGULAR HEART BEAT YEARS AGO DUE TO LOW POTASSIUM  . GERD (gastroesophageal reflux disease)    NO MEDS  . Hypokalemia   . Papilledema    SEES DR POTTER, NEUROLOGIST AT [facility]-HAD MRI (10-2016)WHICH WAS UNREMARKABLE  . Scoliosis 1995  . Vaginal Pap smear, abnormal     Patient Active Problem List   Diagnosis Date Noted  . Anemia 01/02/2017  . Menometrorrhagia 12/11/2016  . Pancreatitis due to common bile duct stone 06/05/2016  . Biliary colic   . Cholelithiasis 05/16/2016  . Idiopathic scoliosis 09/20/2015    Past Surgical History:  Procedure Laterality Date  . CHOLECYSTECTOMY  05/29/2016   Procedure: LAPAROSCOPIC CHOLECYSTECTOMY;  Surgeon: Ricarda Frame, MD;  Location: [facility] ORS;  Service: General;;  . DILITATION & CURRETTAGE/HYSTROSCOPY WITH NOVASURE ABLATION N/A 01/02/2017   Procedure: DILATATION & CURETTAGE/HYSTEROSCOPY WITH NOVASURE ABLATION;  Surgeon: Nadara Mustard, MD;  Location: [facility] ORS;  Service: Gynecology;  Laterality: N/A;  . TUBAL LIGATION N/A 03/21/2016   Procedure: POST PARTUM TUBAL LIGATION;  Surgeon: Conard Novak, MD;   Location: [facility] ORS;  Service: Gynecology;  Laterality: N/A;    Prior to Admission medications   Medication Sig Start Date End Date Taking? Authorizing Provider  albuterol (VENTOLIN HFA) 108 (90 Base) MCG/ACT inhaler Inhale 2 puffs into the lungs every 6 (six) hours as needed for shortness of breath. 12/01/20  Yes Bladen Umar, Charlesetta Ivory, PA-C  azithromycin (ZITHROMAX Z-PAK) 250 MG tablet Take 2 tablets (500 mg) on  Day 1,  followed by 1 tablet (250 mg) once daily on Days 2 through 5. 12/01/20  Yes Charod Slawinski, Charlesetta Ivory, PA-C  benzonatate (TESSALON PERLES) 100 MG capsule Take 1-2 tabs TID prn cough 12/01/20  Yes Kendon Sedeno, Charlesetta Ivory, PA-C  predniSONE (DELTASONE) 20 MG tablet Take 1 tablet (20 mg total) by mouth 2 (two) times daily with a meal for 5 days. 12/01/20 12/06/20 Yes Shailene Demonbreun, Charlesetta Ivory, PA-C  acetaZOLAMIDE (DIAMOX) 250 MG tablet Take 250 mg by mouth 3 (three) times daily.    [provider]  gabapentin (NEURONTIN) 300 MG capsule Take 300 mg by mouth as needed.  11/09/16   [provider]    Allergies Patient has no known allergies.  Family History  Problem Relation Age of Onset  . Heart disease Maternal Grandmother   . Deep vein thrombosis Maternal Grandmother   . Hyperlipidemia Maternal Grandmother   . Anemia Sister   . Ovarian cancer Maternal Aunt   . Lung cancer Maternal Grandfather   . Heart attack Maternal Grandfather     Social History Social History   Tobacco Use  .  Smoking status: Former Smoker    Packs/day: 0.50    Years: 12.00    Pack years: 6.00    Types: Cigarettes    Quit date: 07/23/2015    Years since quitting: 5.3  . Smokeless tobacco: Never Used  Substance Use Topics  . Alcohol use: No  . Drug use: No    Review of Systems  Constitutional: Negative for fever. Eyes: Negative for visual changes. ENT: Negative for sore throat. Cardiovascular: Negative for chest pain. Respiratory: Negative for shortness of breath.  Reports  cough as above. Gastrointestinal: Negative for abdominal pain, vomiting and diarrhea. Genitourinary: Negative for dysuria. Musculoskeletal: Negative for back pain. Skin: Negative for rash. Neurological: Negative for headaches, focal weakness or numbness. ____________________________________________  PHYSICAL EXAM:  VITAL SIGNS: ED Triage Vitals  Enc Vitals Group     BP 12/01/20 1749 (!) 145/86     Pulse Rate 12/01/20 1749 88     Resp 12/01/20 1749 20     Temp 12/01/20 1749 98.1 F (36.7 C)     Temp Source 12/01/20 1749 Oral     SpO2 12/01/20 1749 93 %     Weight 12/01/20 1750 160 lb (72.6 kg)     Height 12/01/20 1750 5\' 2"  (1.575 m)     Head Circumference --      Peak Flow --      Pain Score 12/01/20 1750 6     Pain Loc --      Pain Edu? --      Excl. in [place]? --     Constitutional: Alert and oriented. Well appearing and in no distress. Head: Normocephalic and atraumatic. Eyes: Conjunctivae are normal. Normal extraocular movements Cardiovascular: Normal rate, regular rhythm. Normal distal pulses. Respiratory: Normal respiratory effort. No wheezes/rales/rhonchi. Musculoskeletal: Nontender with normal range of motion in all extremities.  Neurologic:  Normal gait without ataxia. Normal speech and language. No gross focal neurologic deficits are appreciated. Skin:  Skin is warm, dry and intact. No rash noted. Psychiatric: Mood and affect are normal. Patient exhibits appropriate insight and judgment. ____________________________________________   LABS (pertinent positives/negatives) Labs Reviewed  SARS CORONAVIRUS 2 (TAT 6-24 HRS)  ____________________________________________   RADIOLOGY  CXR  IMPRESSION: 1. Prominent bronchovascular markings which may reflect bronchitis or reactive airway disease. No lobar pneumonia. ____________________________________________  PROCEDURES  Prednisone 60 mg PO Benzonatate 200 mg  PO  Procedures ____________________________________________  INITIAL IMPRESSION / ASSESSMENT AND PLAN / ED COURSE  DDX: Covid, viral URI, bronchitis  Patient with ED evaluation of intermittent productive cough with an episode of blood-tinged sputum.  Patient with history of asthma and bronchitis, presents with persistent cough and mild shortness of breath.  She was found to have normal vital signs and without signs of acute respiratory distress.  Her chest x-ray did reveal some bronchitis or reactive airway changes without a focal pneumonia.  Patient was treated empirically with steroids, albuterol inhaler, Tessalon Perles.  She is encouraged to follow with primary provider for ongoing symptoms.  A coronavirus assay is pending at time of this discharge.  SAYDA GRABLE was evaluated in Emergency Department on 12/02/2020 for the symptoms described in the history of present illness. She was evaluated in the context of the global COVID-19 pandemic, which necessitated consideration that the patient might be at risk for infection with the SARS-CoV-2 virus that causes COVID-19. Institutional protocols and algorithms that pertain to the evaluation of patients at risk for COVID-19 are in a state of rapid change based on information  released by regulatory bodies including the CDC and federal and state organizations. These policies and algorithms were followed during the patient's care in the ED. ____________________________________________  FINAL CLINICAL IMPRESSION(S) / ED DIAGNOSES  Final diagnoses:  Bronchitis      Stanley Lyness, Dannielle Karvonen, PA-C 12/02/20 0006    Lucrezia Starch, MD 12/02/20 [phone]

## 2021-07-02 ENCOUNTER — Emergency Department: Payer: Worker's Compensation

## 2021-07-02 ENCOUNTER — Other Ambulatory Visit: Payer: Self-pay

## 2021-07-02 ENCOUNTER — Emergency Department
Admission: EM | Admit: 2021-07-02 | Discharge: 2021-07-02 | Disposition: A | Discharge status: Home / Self Care | Payer: Worker's Compensation | Attending: Emergency Medicine | Admitting: Emergency Medicine

## 2021-07-02 DIAGNOSIS — S9031XA Contusion of right foot, initial encounter: Secondary | ICD-10-CM | POA: Insufficient documentation

## 2021-07-02 DIAGNOSIS — T1490XA Injury, unspecified, initial encounter: Secondary | ICD-10-CM

## 2021-07-02 DIAGNOSIS — S99921A Unspecified injury of right foot, initial encounter: Secondary | ICD-10-CM | POA: Diagnosis present

## 2021-07-02 DIAGNOSIS — Z87891 Personal history of nicotine dependence: Secondary | ICD-10-CM | POA: Diagnosis not present

## 2021-07-02 DIAGNOSIS — M7989 Other specified soft tissue disorders: Secondary | ICD-10-CM

## 2021-07-02 DIAGNOSIS — Y990 Civilian activity done for income or pay: Secondary | ICD-10-CM | POA: Diagnosis not present

## 2021-07-02 DIAGNOSIS — W208XXA Other cause of strike by thrown, projected or falling object, initial encounter: Secondary | ICD-10-CM | POA: Insufficient documentation

## 2021-07-02 DIAGNOSIS — M79674 Pain in right toe(s): Secondary | ICD-10-CM

## 2021-07-02 NOTE — ED Provider Notes (Signed)
[facility] Emergency Department Provider Note ____________________________________________  Time seen: 58  I have reviewed the triage vital signs and the nursing notes.  HISTORY  Chief Complaint  Foot Injury   HPI Allison Flynn is a 40 y.o. female presents to the ER today with complaint of right foot pain and swelling.  She reports while at work, she was unloading a pallet full of water when the pallet jack ran over her foot.  She reports initially, she felt numbness but now reports the pain as throbbing and burning.  She has had difficulty with weightbearing.  She has applied ice but has not taken any medications prior to arrival.  Past Medical History:  Diagnosis Date   Anemia    Anxiety    Dysrhythmia    IRREGULAR HEART BEAT YEARS AGO DUE TO LOW POTASSIUM   GERD (gastroesophageal reflux disease)    NO MEDS   Hypokalemia    Papilledema    SEES DR POTTER, NEUROLOGIST AT [facility]-HAD MRI (10-2016)WHICH WAS UNREMARKABLE   Scoliosis 1995   Vaginal Pap smear, abnormal     Patient Active Problem List   Diagnosis Date Noted   Anemia 01/02/2017   Menometrorrhagia 12/11/2016   Pancreatitis due to common bile duct stone 99991111   Biliary colic    Cholelithiasis 05/16/2016   Idiopathic scoliosis 09/20/2015    Past Surgical History:  Procedure Laterality Date   CHOLECYSTECTOMY  05/29/2016   Procedure: LAPAROSCOPIC CHOLECYSTECTOMY;  Surgeon: Clayburn Pert, MD;  Location: [facility] ORS;  Service: General;;   [place] N/A 01/02/2017   Procedure: DILATATION & CURETTAGE/HYSTEROSCOPY WITH NOVASURE ABLATION;  Surgeon: Gae Dry, MD;  Location: [facility] ORS;  Service: Gynecology;  Laterality: N/A;   TUBAL LIGATION N/A 03/21/2016   Procedure: POST PARTUM TUBAL LIGATION;  Surgeon: Will Bonnet, MD;  Location: [facility] ORS;  Service: Gynecology;  Laterality: N/A;    Prior to Admission medications    Medication Sig Start Date End Date Taking? Authorizing Provider  acetaZOLAMIDE (DIAMOX) 250 MG tablet Take 250 mg by mouth 3 (three) times daily.    [provider]  albuterol (VENTOLIN HFA) 108 (90 Base) MCG/ACT inhaler Inhale 2 puffs into the lungs every 6 (six) hours as needed for shortness of breath. 12/01/20   Menshew, Dannielle Karvonen, PA-C  azithromycin (ZITHROMAX Z-PAK) 250 MG tablet Take 2 tablets (500 mg) on  Day 1,  followed by 1 tablet (250 mg) once daily on Days 2 through 5. 12/01/20   Menshew, Dannielle Karvonen, PA-C  benzonatate (TESSALON PERLES) 100 MG capsule Take 1-2 tabs TID prn cough 12/01/20   Menshew, Dannielle Karvonen, PA-C  gabapentin (NEURONTIN) 300 MG capsule Take 300 mg by mouth as needed.  11/09/16   [provider]    Allergies Patient has no known allergies.  Family History  Problem Relation Age of Onset   Heart disease Maternal Grandmother    Deep vein thrombosis Maternal Grandmother    Hyperlipidemia Maternal Grandmother    Anemia Sister    Ovarian cancer Maternal Aunt    Lung cancer Maternal Grandfather    Heart attack Maternal Grandfather     Social History Social History   Tobacco Use   Smoking status: Former    Packs/day: 0.50    Years: 12.00    Pack years: 6.00    Types: Cigarettes    Quit date: 07/23/2015    Years since quitting: 5.9   Smokeless tobacco:  Never  Substance Use Topics   Alcohol use: No   Drug use: No    Review of Systems  Constitutional: Negative for fever, chills or body aches. Cardiovascular: Negative for chest pain or chest tightness. Respiratory: Negative for shortness of breath. Musculoskeletal: Positive for right foot pain and swelling, difficulty with ambulation. Negative for right ankle pain. Skin: Positive for abrasion and bruising to RLE. Neurological: Positive for focal weakness of the RLE. Negaitve for numbness or tingling of the RLE. ____________________________________________  PHYSICAL  EXAM:  VITAL SIGNS: ED Triage Vitals  Enc Vitals Group     BP 07/02/21 1818 (!) 142/85     Pulse Rate 07/02/21 1818 89     Resp 07/02/21 1818 17     Temp 07/02/21 1822 98.3 F (36.8 C)     Temp Source 07/02/21 1822 Oral     SpO2 07/02/21 1818 98 %     Weight 07/02/21 1819 184 lb (83.5 kg)     Height 07/02/21 1819 '5\' 2"'$  (1.575 m)     Head Circumference --      Peak Flow --      Pain Score 07/02/21 1819 7     Pain Loc --      Pain Edu? --      Excl. in [place]? --     Constitutional: Alert and oriented. Appears in pain but in no distress. Head: Normocephalic. Cardiovascular: Normal rate, regular rhythm.  Respiratory: Normal respiratory effort. No wheezes/rales/rhonchi. Musculoskeletal: Normal plantar flexion and rotation of the right ankle. Pain with dorsiflexion. Normal passive flexion and extension of the left great toe. Pain with palpation over the right metatarsals 1-5. 1+ swelling noted over the right midfoot. Neurologic:  Normal speech and language. No gross focal neurologic deficits are appreciated. Skin:  Bruising and abrasion noted over the right MTP. ____________________________________________   RADIOLOGY Imaging Orders  DG Foot Complete Right  DG Ankle Complete Right  IMPRESSION: No acute osseous abnormality in the right foot or ankle.  ____________________________________________    INITIAL IMPRESSION / ASSESSMENT AND PLAN / ED COURSE  Right Foot Pain and Swelling, Work Injury:  Xray right foot negative for acute fracture Xray right ankle negative for acute fracture ACE wrap applied Crutches given RX for Ibuprofen 600 mg Q8H prn with food Encouraged rest, ice and elevation Work noted provided  _______________________________________________  FINAL CLINICAL IMPRESSION(S) / ED DIAGNOSES  Final diagnoses:  Trauma  Pain and swelling of toe of right foot  Work related injury      Jearld Fenton, NP 07/02/21 1954    Lucrezia Starch, MD 07/02/21  2255

## 2021-07-02 NOTE — ED Triage Notes (Signed)
Pt states she ran over right foot with pallet tire at work. Rom is limited by pain, CMS is intact, cap refill <3. Pt is AOX4, denies other injury. Skin is scraped on foot, no bleeding noted. Pt will possibly need WC. Pt iced right foot, swelling noted in right ankle.

## 2021-07-02 NOTE — Discharge Instructions (Addendum)
You were seen today for right foot pain and swelling.  Your x-rays are negative for acute fracture.  This is soft tissue swelling.  We recommend rest, ice, elevation.  We have put you in an Ace wrap for comfort and given you crutches to aid with ambulation.  I have provided you with a work note to be off work for 2 days.

## 2021-09-25 ENCOUNTER — Ambulatory Visit: Payer: Self-pay

## 2022-02-22 ENCOUNTER — Encounter: Payer: Self-pay | Admitting: Physician Assistant

## 2022-02-22 ENCOUNTER — Other Ambulatory Visit: Payer: Self-pay

## 2022-02-22 ENCOUNTER — Ambulatory Visit (INDEPENDENT_AMBULATORY_CARE_PROVIDER_SITE_OTHER): Payer: Self-pay | Admitting: Physician Assistant

## 2022-02-22 VITALS — BP 128/83 | HR 85 | Ht 62.0 in | Wt 187.0 lb

## 2022-02-22 DIAGNOSIS — F32A Depression, unspecified: Secondary | ICD-10-CM

## 2022-02-22 DIAGNOSIS — R5383 Other fatigue: Secondary | ICD-10-CM

## 2022-02-22 DIAGNOSIS — N6322 Unspecified lump in the left breast, upper inner quadrant: Secondary | ICD-10-CM

## 2022-02-22 DIAGNOSIS — E669 Obesity, unspecified: Secondary | ICD-10-CM

## 2022-02-22 DIAGNOSIS — F419 Anxiety disorder, unspecified: Secondary | ICD-10-CM

## 2022-02-22 MED ORDER — CITALOPRAM HYDROBROMIDE 10 MG PO TABS: 10.0000 mg | ORAL_TABLET | Freq: Every day | ORAL | 3 refills | Status: DC

## 2022-02-22 NOTE — Progress Notes (Signed)
? ? ?New patient visit ? ? ?Patient: Allison Flynn   DOB: [birth year]   41 y.o. Female  MRN: [id number] ?Visit Date: 02/22/2022 ? ?Today's healthcare provider: Mardene Speak, PA-C  ? ?Chief Complaint  ?Patient presents with  ? Breast Mass  ? Anxiety  ? Establish Care  ? ?Subjective  ?  ?Allison Flynn is a 41 y.o. female who presents today as a new patient to establish care.  ? ?Patient reports lump left breast approximately size of marble.   Noticed last night and no pain associated.  Denies having nipple discharge or having any trauma. ? ?Pt also reports a lot of extra stress/anxiety recently.  Patient reports going through divorce and challenges with son. Patient had to leave job to be at home with son who has health concerns. ? ?Patient reports gaining some weight for the past 6 mo. ? ? ?  02/22/2022  ?  2:07 PM  ?GAD 7 : Generalized Anxiety Score  ?Nervous, Anxious, on Edge 3  ?Control/stop worrying 3  ?Worry too much - different things 3  ?Trouble relaxing 3  ?Restless 3  ?Easily annoyed or irritable 3  ?Afraid - awful might happen 2  ?Total GAD 7 Score 20  ?Anxiety Difficulty Somewhat difficult  ? ?  ? ?  02/22/2022  ?  2:22 PM  ?Depression screen PHQ 2/9  ?Decreased Interest 1  ?Down, Depressed, Hopeless 1  ?PHQ - 2 Score 2  ?Altered sleeping 2  ?Tired, decreased energy 2  ?Change in appetite 2  ?Feeling bad or failure about yourself  2  ?Trouble concentrating 1  ?Moving slowly or fidgety/restless 0  ?Suicidal thoughts 0  ?PHQ-9 Score 11  ?Difficult doing work/chores Somewhat difficult  ?    ? ? ?Past Medical History:  ?Diagnosis Date  ? Allergy   ? Anemia   ? Anxiety   ? Asthma   ? Blood transfusion without reported diagnosis   ? Dysrhythmia   ? IRREGULAR HEART BEAT YEARS AGO DUE TO LOW POTASSIUM  ? GERD (gastroesophageal reflux disease)   ? NO MEDS  ? Hypokalemia   ? Papilledema   ? SEES DR Melrose Nakayama, NEUROLOGIST AT [facility] MRI (10-2016)WHICH WAS UNREMARKABLE  ? Scoliosis 1995  ? Vaginal  Pap smear, abnormal   ? ?Past Surgical History:  ?Procedure Laterality Date  ? CHOLECYSTECTOMY  05/29/2016  ? Procedure: LAPAROSCOPIC CHOLECYSTECTOMY;  Surgeon: Clayburn Pert, MD;  Location: [facility] ORS;  Service: General;;  ? [place] N/A 01/02/2017  ? Procedure: DILATATION & CURETTAGE/HYSTEROSCOPY WITH NOVASURE ABLATION;  Surgeon: Gae Dry, MD;  Location: [facility] ORS;  Service: Gynecology;  Laterality: N/A;  ? TUBAL LIGATION N/A 03/21/2016  ? Procedure: POST PARTUM TUBAL LIGATION;  Surgeon: Will Bonnet, MD;  Location: [facility] ORS;  Service: Gynecology;  Laterality: N/A;  ? ?Family Status  ?Relation Name Status  ? Sister  (Not Specified)  ? Mat Aunt  (Not Specified)  ? MGM  (Not Specified)  ? MGF  (Not Specified)  ? ?Family History  ?Problem Relation Age of Onset  ? Anemia Sister   ? Ovarian cancer Maternal Aunt   ? Heart disease Maternal Grandmother   ? Deep vein thrombosis Maternal Grandmother   ? Hyperlipidemia Maternal Grandmother   ? Heart attack Maternal Grandmother   ? Lung cancer Maternal Grandfather   ? Heart attack Maternal Grandfather   ? ?Social History  ? ?Socioeconomic History  ? Marital status: Married  ?  Spouse name: Not on file  ? Number of children: Not on file  ? Years of education: Not on file  ? Highest education level: Not on file  ?Occupational History  ? Not on file  ?Tobacco Use  ? Smoking status: Former  ?  Packs/day: 0.50  ?  Years: 12.00  ?  Pack years: 6.00  ?  Types: Cigarettes  ?  Quit date: 07/23/2015  ?  Years since quitting: 6.5  ? Smokeless tobacco: Never  ?Substance and Sexual Activity  ? Alcohol use: No  ? Drug use: No  ? Sexual activity: Yes  ?Other Topics Concern  ? Not on file  ?Social History Narrative  ? Not on file  ? ?Social Determinants of Health  ? ?Financial Resource Strain: Not on file  ?Food Insecurity: Not on file  ?Transportation Needs: Not on file  ?Physical Activity: Not on file  ?Stress: Not on file  ?Social  Connections: Not on file  ? ?Outpatient Medications Prior to Visit  ?Medication Sig  ? acetaZOLAMIDE (DIAMOX) 250 MG tablet Take 250 mg by mouth 3 (three) times daily.  ? albuterol (VENTOLIN HFA) 108 (90 Base) MCG/ACT inhaler Inhale 2 puffs into the lungs every 6 (six) hours as needed for shortness of breath.  ? azithromycin (ZITHROMAX Z-PAK) 250 MG tablet Take 2 tablets (500 mg) on  Day 1,  followed by 1 tablet (250 mg) once daily on Days 2 through 5.  ? gabapentin (NEURONTIN) 300 MG capsule Take 300 mg by mouth as needed.   ? [DISCONTINUED] benzonatate (TESSALON PERLES) 100 MG capsule Take 1-2 tabs TID prn cough  ? ?No facility-administered medications prior to visit.  ? ?No Known Allergies ? ?Immunization History  ?Administered Date(s) Administered  ? PPD Test 09/08/2019, 10/03/2019  ? ? ?Health Maintenance  ?Topic Date Due  ? COVID-19 Vaccine (1) Never done  ? Hepatitis C Screening  Never done  ? PAP SMEAR-Modifier  10/13/2018  ? INFLUENZA VACCINE  07/04/2021  ? TETANUS/TDAP  01/05/2026  ? HIV Screening  Completed  ? HPV VACCINES  Aged Out  ? ? ?Patient Care Team: ?Elberta Leatherwood as PCP - General (Physician Assistant) ? ?Review of Systems  ?Constitutional:  Positive for activity change and chills.  ?HENT:  Positive for congestion and rhinorrhea.   ?Endocrine: Positive for cold intolerance and polydipsia.  ?Musculoskeletal:  Positive for arthralgias, back pain, myalgias and neck pain.  ?Psychiatric/Behavioral:  Positive for agitation and sleep disturbance. The patient is nervous/anxious.   ? ? Objective  ?  ?BP 128/83 (BP Location: Right Arm, Patient Position: Sitting, Cuff Size: Normal)   Pulse 85   Ht '5\' 2"'$  (1.575 m)   Wt 187 lb (84.8 kg)   SpO2 98%   BMI 34.20 kg/m?  ? ?Physical Exam ?Vitals and nursing note reviewed.  ?Constitutional:   ?   Appearance: Normal appearance. She is obese.  ?HENT:  ?   Head: Normocephalic and atraumatic.  ?Cardiovascular:  ?   Rate and Rhythm: Normal rate and regular  rhythm.  ?   Pulses: Normal pulses.  ?   Heart sounds: Normal heart sounds.  ?Pulmonary:  ?   Effort: Pulmonary effort is normal.  ?   Breath sounds: Normal breath sounds.  ?Musculoskeletal:     ?   General: No swelling or tenderness.  ?   Right lower leg: No edema.  ?   Left lower leg: No edema.  ?   Comments: Kyphoscoliosis  ?Neurological:  ?  General: No focal deficit present.  ?   Mental Status: She is alert and oriented to person, place, and time.  ?   Motor: No weakness.  ?   Gait: Gait normal.  ?   Deep Tendon Reflexes: Reflexes normal.  ?Psychiatric:     ?   Behavior: Behavior normal.     ?   Thought Content: Thought content normal.     ?   Judgment: Judgment normal.  ? ? ?Depression Screen ? ?  02/22/2022  ?  2:22 PM  ?PHQ 2/9 Scores  ?PHQ - 2 Score 2  ?PHQ- 9 Score 11  ? ? ? ? Assessment & Plan   ?  ?1. Fatigue, unspecified type ?Hx of iron-deficiency anemia, fatigue and cold intolerance. ?- CBC with Differential/Platelet ?- TSH ? ?2. Anxiety/Depression ?- GAD7 20 - severe,  PHQ9 11 - moderate ?- citalopram (CELEXA) 10 MG tablet; Take 1 tablet (10 mg total) by mouth daily.  Dispense: 30 tablet; Refill: 3 ?- exercise, mindfulness-based stress reduction have been shown to be an effective augmenting treatment for patients with GAD and depression. ?- FU/physical in a month ? ?3. Mass of upper inner quadrant of left breast ?Painless and mobile. ?- MM DIAG BREAST TOMO BILATERAL; Future ? ?4. Obesity (BMI 30.0-34.9) ?- Lifestyle modifications: weight loss, low calorie diet, exercise. ? ?5. Congestion and rhinorrhea due to seasonal allergy or smoking ?- Smoking cessation ?- OTC antihistamines and flonase - symptomatic treatment ? ?The patient was advised to call back or seek an in-person evaluation if the symptoms worsen or if the condition fails to improve as anticipated. ? ?I discussed the assessment and treatment plan with the patient. The patient was provided an opportunity to ask questions and all were  answered. The patient agreed with the plan and demonstrated an understanding of the instructions. ? ?The entirety of the information documented in the History of Present Illness, Review of Systems and Physical Exam

## 2022-02-23 ENCOUNTER — Other Ambulatory Visit: Payer: Self-pay

## 2022-02-23 ENCOUNTER — Other Ambulatory Visit: Payer: Self-pay | Admitting: Physician Assistant

## 2022-02-23 DIAGNOSIS — N6322 Unspecified lump in the left breast, upper inner quadrant: Secondary | ICD-10-CM

## 2022-02-23 LAB — CBC WITH DIFFERENTIAL/PLATELET
Basophils Absolute: 0 10*3/uL (ref 0.0–0.2)
Basos: 0 %
EOS (ABSOLUTE): 0.2 10*3/uL (ref 0.0–0.4)
Eos: 3 %
Hematocrit: 42.4 % (ref 34.0–46.6)
Hemoglobin: 15.1 g/dL (ref 11.1–15.9)
Immature Grans (Abs): 0 10*3/uL (ref 0.0–0.1)
Immature Granulocytes: 0 %
Lymphocytes Absolute: 3.5 10*3/uL — ABNORMAL HIGH (ref 0.7–3.1)
Lymphs: 46 %
MCH: 31.8 pg (ref 26.6–33.0)
MCHC: 35.6 g/dL (ref 31.5–35.7)
MCV: 89 fL (ref 79–97)
Monocytes Absolute: 0.5 10*3/uL (ref 0.1–0.9)
Monocytes: 6 %
Neutrophils Absolute: 3.5 10*3/uL (ref 1.4–7.0)
Neutrophils: 45 %
Platelets: 224 10*3/uL (ref 150–450)
RBC: 4.75 x10E6/uL (ref 3.77–5.28)
RDW: 12.2 % (ref 11.7–15.4)
WBC: 7.8 10*3/uL (ref 3.4–10.8)

## 2022-02-23 LAB — TSH: TSH: 2.02 u[IU]/mL (ref 0.450–4.500)

## 2022-02-28 ENCOUNTER — Other Ambulatory Visit: Payer: Self-pay | Admitting: Obstetrics and Gynecology

## 2022-02-28 ENCOUNTER — Ambulatory Visit: Payer: Medicaid Other | Attending: Hematology and Oncology | Admitting: *Deleted

## 2022-02-28 ENCOUNTER — Other Ambulatory Visit: Payer: Self-pay

## 2022-02-28 ENCOUNTER — Other Ambulatory Visit: Payer: Self-pay | Admitting: Physician Assistant

## 2022-02-28 DIAGNOSIS — Z01419 Encounter for gynecological examination (general) (routine) without abnormal findings: Secondary | ICD-10-CM

## 2022-02-28 DIAGNOSIS — N6322 Unspecified lump in the left breast, upper inner quadrant: Secondary | ICD-10-CM

## 2022-02-28 NOTE — Progress Notes (Signed)
?  Subjective:  ?  ? Patient ID: Allison Flynn, female   DOB: February 07, 1981, 41 y.o.   MRN: [id number] ? ?HPI ? ? ?Review of Systems ? ?   ?Objective:  ? Physical Exam ?Chest:  ?Breasts: ?   Right: No swelling, bleeding, inverted nipple, mass, nipple discharge, skin change or tenderness.  ?   Left: Mass present. No swelling, bleeding, inverted nipple, nipple discharge, skin change or tenderness.  ? ? ?Abdominal:  ?   Palpations: There is no hepatomegaly or splenomegaly.  ?Genitourinary: ?   Exam position: Lithotomy position.  ?   Labia:     ?   Right: No rash, tenderness, lesion or injury.     ?   Left: No rash, tenderness, lesion or injury.   ?   Vagina: No signs of injury and foreign body. Vaginal discharge present. No erythema, tenderness, bleeding, lesions or prolapsed vaginal walls.  ?   Cervix: Friability present. No cervical motion tenderness, discharge, lesion, erythema, cervical bleeding or eversion.  ?   Uterus: Not deviated and not enlarged.   ?   Adnexa:     ?   Right: No mass.      ?   Left: No mass.    ? ? ? ?Lymphadenopathy:  ?   Upper Body:  ?   Right upper body: No supraclavicular or axillary adenopathy.  ?   Left upper body: No supraclavicular or axillary adenopathy.  ? ?   ?Assessment:  ?   ?41 year old female referred to [facility] by Denna Haggard, PA-C, for further evaluation for a new found left breast mass.  Patient states she found the mass herself about 1 week ago.  Denies any pain or changes in the last week.  Family history of breast cancer in a maternal cousin that was diagnosed with breast cancer in her 34's.  On clinical breast exam I can palpate an approximate 1 cm mobile nodule at 9:30 left breast 3 cm from the nipple.  Patient states this is the area of concern.  Taught self breast awareness.  Specimen collected for pap smear without difficulty. Patient has been screened for eligibility.  She does not have any insurance, Medicare or Medicaid.  She also meets financial eligibility.    ?Risk Assessment   ? ? Risk Scores   ? ?   02/28/2022  ? Last edited by: Drue Dun, RN  ? 5-year risk: 0.4 %  ? Lifetime risk: 8.1 %  ? ?  ?  ? ?  ?  ?   ?Plan:  ?   ?Bilateral diagnostic mammogram and ultrasound ordered.  Jeanella Anton to call patient with her appointment.  Will follow up per [facility] protocol. ?   ?

## 2022-03-07 LAB — CYTOLOGY - PAP
Comment: NEGATIVE
Comment: NEGATIVE
Comment: NEGATIVE
Diagnosis: NEGATIVE
HPV 16: NEGATIVE
HPV 18 / 45: NEGATIVE
High risk HPV: POSITIVE — AB

## 2022-03-10 ENCOUNTER — Telehealth: Payer: Self-pay

## 2022-03-10 NOTE — Telephone Encounter (Signed)
Patient informed Pap smear results negative with positive HPV, same as before, needs colposcopy. Discussed colposcopy. I will contact [facility] to schedule.  ?

## 2022-03-14 ENCOUNTER — Telehealth: Payer: Self-pay

## 2022-03-14 NOTE — Progress Notes (Signed)
Please call patient with results and let her know Dr. Domenic Schwab recommendation

## 2022-03-14 NOTE — Telephone Encounter (Signed)
Per Dr. Elly Modena, Patient informed needs to repeat pap in 1 year, no colposcopy as previously informed, as pap is negative with positive HPV. Patient verbalized understanding.  ?

## 2022-03-16 ENCOUNTER — Ambulatory Visit
Admission: RE | Admit: 2022-03-16 | Discharge: 2022-03-16 | Disposition: A | Discharge status: Home / Self Care | Payer: Self-pay | Source: Ambulatory Visit | Attending: Physician Assistant | Admitting: Physician Assistant

## 2022-03-16 DIAGNOSIS — N6322 Unspecified lump in the left breast, upper inner quadrant: Secondary | ICD-10-CM | POA: Insufficient documentation

## 2022-03-21 ENCOUNTER — Other Ambulatory Visit: Payer: Self-pay | Admitting: Obstetrics and Gynecology

## 2022-03-21 DIAGNOSIS — N6320 Unspecified lump in the left breast, unspecified quadrant: Secondary | ICD-10-CM

## 2022-03-28 ENCOUNTER — Ambulatory Visit (INDEPENDENT_AMBULATORY_CARE_PROVIDER_SITE_OTHER): Payer: Self-pay | Admitting: Physician Assistant

## 2022-03-28 ENCOUNTER — Encounter: Payer: Self-pay | Admitting: Physician Assistant

## 2022-03-28 VITALS — BP 121/90 | HR 98 | Temp 98.0°F | Resp 16 | Wt 187.2 lb

## 2022-03-28 DIAGNOSIS — Z1159 Encounter for screening for other viral diseases: Secondary | ICD-10-CM

## 2022-03-28 DIAGNOSIS — L989 Disorder of the skin and subcutaneous tissue, unspecified: Secondary | ICD-10-CM

## 2022-03-28 DIAGNOSIS — Z0000 Encounter for general adult medical examination without abnormal findings: Secondary | ICD-10-CM

## 2022-03-28 DIAGNOSIS — F419 Anxiety disorder, unspecified: Secondary | ICD-10-CM

## 2022-03-28 DIAGNOSIS — E669 Obesity, unspecified: Secondary | ICD-10-CM

## 2022-03-28 NOTE — Progress Notes (Signed)
? ? ? ?I,Lawrence Roldan Robinson,acting as a Education administrator for [employer], PA-C.,have documented all relevant documentation on the behalf of Mardene Speak, PA-C,as directed by  [employer], PA-C while in the presence of Mardene Speak, PA-C.' ?Complete physical exam ? ? ?Patient: Allison Flynn   DOB: 07-Jul-1981   41 y.o. Female  MRN: [id number] ?Visit Date: 03/28/2022 ? ?Today's healthcare provider: Mardene Speak, PA-C  ? ?Chief Complaint  ?Patient presents with  ? Annual Exam  ? ?Subjective  ?  ?Allison Flynn is a 41 y.o. female who presents today for a complete physical exam.  ?She reports consuming a general diet. The patient does not participate in regular exercise at present. She generally feels well. She reports sleeping fairly well. She does not have additional problems to discuss today.  ?Patient reports she had a pap at the cancer center on 03-21-22.  Also reports they saw a spot on her back while doing breast exam and advised her to have checked with her PCP.  ? ? ?Anxiety, Follow-up ? ?She was last seen for anxiety 1 months ago. ?Changes made at last visit include Citalopram '10mg'$  daily. ?  ?She reports excellent compliance with treatment. ?She reports excellent tolerance of treatment. ?She is not having side effects.  ? ?She feels her anxiety is mild and Improved since last visit. ? ?Symptoms: ?No chest pain No difficulty concentrating  ?No dizziness No fatigue  ?No feelings of losing control No insomnia  ?No irritable No palpitations  ?No panic attacks No racing thoughts  ?No shortness of breath No sweating  ?No tremors/shakes   ? ? ?  03/28/2022  ?  9:29 AM 02/22/2022  ?  2:07 PM  ?GAD 7 : Generalized Anxiety Score  ?Nervous, Anxious, on Edge 1 3  ?Control/stop worrying 0 3  ?Worry too much - different things 1 3  ?Trouble relaxing 0 3  ?Restless 1 3  ?Easily annoyed or irritable 0 3  ?Afraid - awful might happen 0 2  ?Total GAD 7 Score 3 20  ?Anxiety Difficulty Not difficult at all Somewhat difficult   ? ? ? ?GAD-7 Results ? ?  03/28/2022  ?  9:29 AM 02/22/2022  ?  2:07 PM  ?GAD-7 Generalized Anxiety Disorder Screening Tool  ?1. Feeling Nervous, Anxious, or on Edge 1 3  ?2. Not Being Able to Stop or Control Worrying 0 3  ?3. Worrying Too Much About Different Things 1 3  ?4. Trouble Relaxing 0 3  ?5. Being So Restless it's Hard To Sit Still 1 3  ?6. Becoming Easily Annoyed or Irritable 0 3  ?7. Feeling Afraid As If Something Awful Might Happen 0 2  ?Total GAD-7 Score 3 20  ?Difficulty At Work, Home, or Getting  Along With Others? Not difficult at all Somewhat difficult  ? ? ?PHQ-9 Scores ? ?  02/22/2022  ?  2:22 PM  ?PHQ9 SCORE ONLY  ?PHQ-9 Total Score 11  ? ? ?---------------------------------------------------------------------------------------------------  ? ?Past Medical History:  ?Diagnosis Date  ? Allergy   ? Anemia   ? Anxiety   ? Asthma   ? Blood transfusion without reported diagnosis   ? Dysrhythmia   ? IRREGULAR HEART BEAT YEARS AGO DUE TO LOW POTASSIUM  ? GERD (gastroesophageal reflux disease)   ? NO MEDS  ? Hypokalemia   ? Papilledema   ? SEES DR Melrose Nakayama, NEUROLOGIST AT [facility] MRI (10-2016)WHICH WAS UNREMARKABLE  ? Scoliosis 1995  ? Vaginal Pap smear, abnormal   ? ?  Past Surgical History:  ?Procedure Laterality Date  ? CHOLECYSTECTOMY  05/29/2016  ? Procedure: LAPAROSCOPIC CHOLECYSTECTOMY;  Surgeon: Clayburn Pert, MD;  Location: [facility] OR[facility];  Service: General;;  ? [place] N/A 01/02/2017  ? Procedure: DILATATION & CURETTAGE/HYSTEROSCOPY WITH NOVASURE ABLATION;  Surgeon: Gae Dry, MD;  Location: [facility];  Service: Gynecology;  Laterality: N/A;  ? TUBAL LIGATION N/A 03/21/2016  ? Procedure: POST PARTUM TUBAL LIGATION;  Surgeon: Will Bonnet, MD;  Location: [facility];  Service: Gynecology;  Laterality: N/A;  ? ?Social History  ? ?Socioeconomic History  ? Marital status: Married  ?  Spouse name: Not on file  ? Number of children: Not on  file  ? Years of education: Not on file  ? Highest education level: Not on file  ?Occupational History  ? Not on file  ?Tobacco Use  ? Smoking status: Former  ?  Packs/day: 0.50  ?  Years: 12.00  ?  Pack years: 6.00  ?  Types: Cigarettes  ?  Quit date: 07/23/2015  ?  Years since quitting: 6.6  ? Smokeless tobacco: Never  ?Substance and Sexual Activity  ? Alcohol use: No  ? Drug use: No  ? Sexual activity: Yes  ?Other Topics Concern  ? Not on file  ?Social History Narrative  ? Not on file  ? ?Social Determinants of Health  ? ?Financial Resource Strain: Not on file  ?Food Insecurity: Not on file  ?Transportation Needs: Not on file  ?Physical Activity: Not on file  ?Stress: Not on file  ?Social Connections: Not on file  ?Intimate Partner Violence: Not on file  ? ?Family Status  ?Relation Name Status  ? Sister  (Not Specified)  ? Mat Aunt  (Not Specified)  ? MGM  (Not Specified)  ? MGF  (Not Specified)  ? ?Family History  ?Problem Relation Age of Onset  ? Anemia Sister   ? Ovarian cancer Maternal Aunt   ? Heart disease Maternal Grandmother   ? Deep vein thrombosis Maternal Grandmother   ? Hyperlipidemia Maternal Grandmother   ? Heart attack Maternal Grandmother   ? Lung cancer Maternal Grandfather   ? Heart attack Maternal Grandfather   ? ?No Known Allergies  ?Patient Care Team: ?Elberta Leatherwood as PCP - General (Physician Assistant) ?[place]ann[place], Heath Gold, RN  ? ?Medications: ?Outpatient Medications Prior to Visit  ?Medication Sig  ? albuterol (VENTOLIN HFA) 108 (90 Base) MCG/ACT inhaler Inhale 2 puffs into the lungs every 6 (six) hours as needed for shortness of breath.  ? citalopram (CELEXA) 10 MG tablet Take 1 tablet (10 mg total) by mouth daily.  ? ?No facility-administered medications prior to visit.  ? ? ?Review of Systems  ?Respiratory: Negative.    ?Cardiovascular: Negative.   ?Gastrointestinal: Negative.   ?See hpi ? ? Objective  ? ?  ?BP 121/90 (BP Location: Right Arm, Patient Position: Sitting, Cuff  Size: Normal)   Pulse 98   Temp 98 ?F (36.7 ?C) (Oral)   Resp 16   Wt 187 lb 3.2 oz (84.9 kg)   SpO2 98%   BMI 34.24 kg/m?  ? ? ? ?Physical Exam ?Vitals and nursing note reviewed.  ?Constitutional:   ?   Appearance: Normal appearance. She is obese.  ?HENT:  ?   Head: Normocephalic and atraumatic.  ?   Right Ear: Tympanic membrane normal.  ?   Left Ear: Tympanic membrane normal.  ?   Nose: Nose normal.  ?  Mouth/Throat:  ?   Mouth: Mucous membranes are moist.  ?Eyes:  ?   Extraocular Movements: Extraocular movements intact.  ?   Conjunctiva/sclera: Conjunctivae normal.  ?   Pupils: Pupils are equal, round, and reactive to light.  ?Cardiovascular:  ?   Rate and Rhythm: Normal rate and regular rhythm.  ?   Pulses: Normal pulses.  ?   Heart sounds: Normal heart sounds.  ?Pulmonary:  ?   Effort: Pulmonary effort is normal.  ?   Breath sounds: Normal breath sounds.  ?Abdominal:  ?   General: Abdomen is flat. Bowel sounds are normal.  ?   Palpations: Abdomen is soft.  ?Musculoskeletal:     ?   General: Normal range of motion.  ?   Cervical back: Normal range of motion.  ?Skin: ?   Capillary Refill: Capillary refill takes less than 2 seconds.  ?   Comments: Well-demarcated pigmented (skin/ brown /dark brown colored ) lesion with surrounding erythematous halo  ?Neurological:  ?   Mental Status: She is alert.  ?Psychiatric:     ?   Mood and Affect: Mood normal.     ?   Behavior: Behavior normal.     ?   Thought Content: Thought content normal.     ?   Judgment: Judgment normal.  ?  ? ?Last depression screening scores ? ?  02/22/2022  ?  2:22 PM  ?PHQ 2/9 Scores  ?PHQ - 2 Score 2  ?PHQ- 9 Score 11  ? ?Last fall risk screening ? ?  02/22/2022  ?  2:25 PM  ?Fall Risk   ?Falls in the past year? 0  ?Number falls in past yr: 0  ?Injury with Fall? 0  ? ?Last Audit-C alcohol use screening ? ?  02/22/2022  ?  2:25 PM  ?Alcohol Use Disorder Test (AUDIT)  ?1. How often do you have a drink containing alcohol? 0  ?2. How many drinks  containing alcohol do you have on a typical day when you are drinking? 0  ?3. How often do you have six or more drinks on one occasion? 0  ?AUDIT-C Score 0  ? ?A score of 3 or more in women, and 4 or more in men indica

## 2022-03-29 LAB — COMPREHENSIVE METABOLIC PANEL
ALT: 19 IU/L (ref 0–32)
AST: 19 IU/L (ref 0–40)
Albumin/Globulin Ratio: 2 (ref 1.2–2.2)
Albumin: 4.7 g/dL (ref 3.8–4.8)
Alkaline Phosphatase: 62 IU/L (ref 44–121)
BUN/Creatinine Ratio: 10 (ref 9–23)
BUN: 8 mg/dL (ref 6–20)
Bilirubin Total: 0.4 mg/dL (ref 0.0–1.2)
CO2: 22 mmol/L (ref 20–29)
Calcium: 10.1 mg/dL (ref 8.7–10.2)
Chloride: 104 mmol/L (ref 96–106)
Creatinine, Ser: 0.83 mg/dL (ref 0.57–1.00)
Globulin, Total: 2.4 g/dL (ref 1.5–4.5)
Glucose: 91 mg/dL (ref 70–99)
Potassium: 4.4 mmol/L (ref 3.5–5.2)
Sodium: 141 mmol/L (ref 134–144)
Total Protein: 7.1 g/dL (ref 6.0–8.5)
eGFR: 92 mL/min/{1.73_m2} (ref >59)

## 2022-03-29 LAB — HEMOGLOBIN A1C
Est. average glucose Bld gHb Est-mCnc: 100 mg/dL
Hgb A1c MFr Bld: 5.1 % (ref 4.8–5.6)

## 2022-03-29 LAB — LIPID PANEL
Chol/HDL Ratio: 8.2 ratio — ABNORMAL HIGH (ref 0.0–4.4)
Cholesterol, Total: 287 mg/dL — ABNORMAL HIGH (ref 100–199)
HDL: 35 mg/dL — ABNORMAL LOW (ref >39)
LDL Chol Calc (NIH): 208 mg/dL — ABNORMAL HIGH (ref 0–99)
Triglycerides: 225 mg/dL — ABNORMAL HIGH (ref 0–149)
VLDL Cholesterol Cal: 44 mg/dL — ABNORMAL HIGH (ref 5–40)

## 2022-03-29 LAB — HEPATITIS C ANTIBODY: Hep C Virus Ab: NONREACTIVE

## 2022-04-17 ENCOUNTER — Other Ambulatory Visit: Payer: Self-pay

## 2022-04-17 DIAGNOSIS — N6320 Unspecified lump in the left breast, unspecified quadrant: Secondary | ICD-10-CM

## 2022-04-28 NOTE — Progress Notes (Deleted)
      Established patient visit   Patient: Allison Flynn   DOB: 04-22-1981   41 y.o. Female  MRN: [id number] Visit Date: 05/02/2022  Today's healthcare provider: Mardene Speak, PA-C   No chief complaint on file.  Subjective    Depression, Follow-up  She  was last seen for this 9 weeks ago. Changes made at last visit include starting Citalopram 10 mg daily,exercise, mindfulness-based stress reduction .   She reports {excellent/good/fair/poor:19665} compliance with treatment. She {is/is not:21021397} having side effects. ***  She reports {DESC; GOOD/FAIR/POOR:18685} tolerance of treatment. Current symptoms include: {Symptoms; depression:1002} She feels she is {improved/worse/unchanged:3041574} since last visit.     02/22/2022    2:22 PM  Depression screen PHQ 2/9  Decreased Interest 1  Down, Depressed, Hopeless 1  PHQ - 2 Score 2  Altered sleeping 2  Tired, decreased energy 2  Change in appetite 2  Feeling bad or failure about yourself  2  Trouble concentrating 1  Moving slowly or fidgety/restless 0  Suicidal thoughts 0  PHQ-9 Score 11  Difficult doing work/chores Somewhat difficult    -----------------------------------------------------------------------------------------  Medications: Outpatient Medications Prior to Visit  Medication Sig   albuterol (VENTOLIN HFA) 108 (90 Base) MCG/ACT inhaler Inhale 2 puffs into the lungs every 6 (six) hours as needed for shortness of breath.   citalopram (CELEXA) 10 MG tablet Take 1 tablet (10 mg total) by mouth daily.   No facility-administered medications prior to visit.    Review of Systems  {Labs  Heme  Chem  Endocrine  Serology  Results Review (optional):23779}   Objective    There were no vitals taken for this visit. {Show previous vital signs (optional):23777}  Physical Exam  ***  No results found for any visits on 05/02/22.  Assessment & Plan     ***  No follow-ups on file.      {provider  attestation***:1}   Mardene Speak, Hershal Coria  [facility] [phone] (phone) [phone] (fax)  [place]

## 2022-05-02 ENCOUNTER — Ambulatory Visit: Payer: Medicaid Other | Admitting: Physician Assistant

## 2022-06-06 ENCOUNTER — Other Ambulatory Visit: Payer: Self-pay | Admitting: Physician Assistant

## 2022-06-06 DIAGNOSIS — F419 Anxiety disorder, unspecified: Secondary | ICD-10-CM

## 2022-06-07 NOTE — Telephone Encounter (Signed)
Medication is sent to the pharmacy.

## 2022-07-04 ENCOUNTER — Other Ambulatory Visit: Payer: Self-pay | Admitting: Physician Assistant

## 2022-07-04 DIAGNOSIS — F419 Anxiety disorder, unspecified: Secondary | ICD-10-CM

## 2022-08-01 ENCOUNTER — Other Ambulatory Visit: Payer: Self-pay | Admitting: Physician Assistant

## 2022-08-01 DIAGNOSIS — F419 Anxiety disorder, unspecified: Secondary | ICD-10-CM

## 2022-09-02 ENCOUNTER — Other Ambulatory Visit: Payer: Self-pay | Admitting: Physician Assistant

## 2022-09-02 DIAGNOSIS — F419 Anxiety disorder, unspecified: Secondary | ICD-10-CM

## 2022-09-04 NOTE — Telephone Encounter (Signed)
Patient will need and office visit for further refills. Requested Prescriptions  Pending Prescriptions Disp Refills  . citalopram (CELEXA) 10 MG tablet [Pharmacy Med Name: Citalopram Hydrobromide 10 MG Oral Tablet] 90 tablet 0    Sig: Take 1 tablet by mouth once daily     Psychiatry:  Antidepressants - SSRI Passed - 09/02/2022  9:12 AM      Passed - Valid encounter within last 6 months    Recent Outpatient Visits          5 months ago Annual physical exam   [facility], [place], PA-C   6 months ago Fatigue, unspecified type   [facility], [place], [place]

## 2022-09-18 ENCOUNTER — Ambulatory Visit
Admission: RE | Admit: 2022-09-18 | Discharge: 2022-09-18 | Disposition: A | Discharge status: Home / Self Care | Payer: Commercial Managed Care - HMO | Source: Ambulatory Visit | Attending: Obstetrics and Gynecology | Admitting: Obstetrics and Gynecology

## 2022-09-18 DIAGNOSIS — N6320 Unspecified lump in the left breast, unspecified quadrant: Secondary | ICD-10-CM | POA: Insufficient documentation

## 2022-10-02 ENCOUNTER — Other Ambulatory Visit: Payer: Self-pay | Admitting: Obstetrics and Gynecology

## 2022-10-02 DIAGNOSIS — R928 Other abnormal and inconclusive findings on diagnostic imaging of breast: Secondary | ICD-10-CM

## 2022-12-11 ENCOUNTER — Emergency Department: Payer: Medicaid Other

## 2022-12-11 ENCOUNTER — Other Ambulatory Visit: Payer: Self-pay

## 2022-12-11 DIAGNOSIS — U071 COVID-19: Secondary | ICD-10-CM | POA: Insufficient documentation

## 2022-12-11 DIAGNOSIS — R0981 Nasal congestion: Secondary | ICD-10-CM | POA: Diagnosis present

## 2022-12-11 LAB — RESP PANEL BY RT-PCR (RSV, FLU A&B, COVID)  RVPGX2
Influenza A by PCR: NEGATIVE
Influenza B by PCR: NEGATIVE
Resp Syncytial Virus by PCR: NEGATIVE
SARS Coronavirus 2 by RT PCR: POSITIVE — AB

## 2022-12-11 LAB — GROUP A STREP BY PCR: Group A Strep by PCR: NOT DETECTED

## 2022-12-11 NOTE — ED Triage Notes (Signed)
Patient reports cough and congestion for the past 3-4 days. States today she began having shortness of breath with pain on inspiration. AOX4 resp even, unlabored on RA. Ambulatory with steady gait. Speaking in full clear sentences.

## 2022-12-11 NOTE — ED Provider Triage Note (Signed)
Emergency Medicine Provider Triage Evaluation Note  Allison Flynn , a 42 y.o. female  was evaluated in triage.  Pt complains of sore throat, nasal congestion, cough for the past 3 to 4 days.  Chest is sore when she takes a deep breath.  No known fever.   Physical Exam  BP (!) 113/100 (BP Location: Left Arm)   Pulse 96   Temp 98.1 F (36.7 C) (Oral)   Resp 20   Ht '5\' 3"'$  (1.6 m)   Wt 72.6 kg   LMP 12/04/2022 (Approximate)   SpO2 98%   BMI 28.34 kg/m  Gen:   Awake, no distress   Resp:  Normal effort  MSK:   Moves extremities without difficulty  Other:    Medical Decision Making  Medically screening exam initiated at 8:57 PM.  Appropriate orders placed.  Allison Flynn was informed that the remainder of the evaluation will be completed by another provider, this initial triage assessment does not replace that evaluation, and the importance of remaining in the ED until their evaluation is complete.    Victorino Dike, FNP 12/11/22 2352

## 2022-12-12 ENCOUNTER — Emergency Department
Admission: EM | Admit: 2022-12-12 | Discharge: 2022-12-12 | Disposition: A | Discharge status: Home / Self Care | Payer: Medicaid Other | Attending: Emergency Medicine | Admitting: Emergency Medicine

## 2022-12-12 DIAGNOSIS — U071 COVID-19: Secondary | ICD-10-CM

## 2022-12-12 MED ORDER — FAMOTIDINE 20 MG PO TABS: 20.0000 mg | ORAL_TABLET | Freq: Two times a day (BID) | ORAL | 0 refills | Status: AC

## 2022-12-12 MED ORDER — NAPROXEN 500 MG PO TABS
500.0000 mg | ORAL_TABLET | Freq: Once | ORAL | Status: AC
  Administered 2022-12-12: 500 mg via ORAL
  Filled 2022-12-12: qty 1

## 2022-12-12 MED ORDER — ONDANSETRON 4 MG PO TBDP
4.0000 mg | ORAL_TABLET | Freq: Once | ORAL | Status: AC
  Administered 2022-12-12: 4 mg via ORAL
  Filled 2022-12-12: qty 1

## 2022-12-12 MED ORDER — NAPROXEN 500 MG PO TABS: 500.0000 mg | ORAL_TABLET | Freq: Two times a day (BID) | ORAL | 0 refills | Status: AC

## 2022-12-12 MED ORDER — ONDANSETRON 4 MG PO TBDP: 4.0000 mg | ORAL_TABLET | Freq: Three times a day (TID) | ORAL | 0 refills | Status: AC | PRN

## 2022-12-12 MED ORDER — FAMOTIDINE 20 MG PO TABS
40.0000 mg | ORAL_TABLET | Freq: Once | ORAL | Status: AC
  Administered 2022-12-12: 40 mg via ORAL
  Filled 2022-12-12: qty 2

## 2022-12-12 NOTE — ED Provider Notes (Signed)
[facility] Provider Note    Event Date/Time   First MD Initiated Contact with Patient 12/12/22 [id number]     (approximate)   History   Cough and Nasal Congestion   HPI  Allison Flynn is a 42 y.o. female no significant past medical history who comes ED complaining of fatigue and body aches and chills for the past 3 days.  No vomiting or diarrhea.  Has some decreased appetite.  No chest pain or shortness of breath.     Physical Exam   Triage Vital Signs: ED Triage Vitals  Enc Vitals Group     BP 12/11/22 2043 (!) 113/100     Pulse Rate 12/11/22 2043 96     Resp 12/11/22 2043 20     Temp 12/11/22 2043 98.1 F (36.7 C)     Temp Source 12/11/22 2043 Oral     SpO2 12/11/22 2043 98 %     Weight 12/11/22 2046 160 lb (72.6 kg)     Height 12/11/22 2046 '5\' 3"'$  (1.6 m)     Head Circumference --      Peak Flow --      Pain Score 12/11/22 2057 8     Pain Loc --      Pain Edu? --      Excl. in [place]? --     Most recent vital signs: Vitals:   12/11/22 2043  BP: (!) 113/100  Pulse: 96  Resp: 20  Temp: 98.1 F (36.7 C)  SpO2: 98%    General: Awake, no distress.  CV:  Good peripheral perfusion.  Resp:  Normal effort.  Abd:  No distention.  Soft nontender Other:  Moist mucosa   ED Results / Procedures / Treatments   Labs (all labs ordered are listed, but only abnormal results are displayed) Labs Reviewed  RESP PANEL BY RT-PCR (RSV, FLU A&B, COVID)  RVPGX2 - Abnormal; Notable for the following components:      Result Value   SARS Coronavirus 2 by RT PCR POSITIVE (*)    All other components within normal limits  GROUP A STREP BY PCR     RADIOLOGY Chest x-ray interpreted by me, appears normal.  Radiology report reviewed   PROCEDURES:  Procedures   MEDICATIONS ORDERED IN ED: Medications  naproxen (NAPROSYN) tablet 500 mg (500 mg Oral Given 12/12/22 0253)  ondansetron (ZOFRAN-ODT) disintegrating tablet 4 mg (4 mg Oral Given 12/12/22  0253)  famotidine (PEPCID) tablet 40 mg (40 mg Oral Given 12/12/22 0253)     IMPRESSION / MDM / ASSESSMENT AND PLAN / ED COURSE  I reviewed the triage vital signs and the nursing notes.                              Differential diagnosis includes, but is not limited to, viral illness, strep pharyngitis.  Doubt pneumonia UTI sepsis.  Patient presents with constitutional symptoms, flulike illness.  Positive for COVID.  Will treat supportively.  Chest x-ray unremarkable.  Vitals normal.       FINAL CLINICAL IMPRESSION(S) / ED DIAGNOSES   Final diagnoses:  COVID-19 virus infection     Rx / DC Orders   ED Discharge Orders          Ordered    ondansetron (ZOFRAN-ODT) 4 MG disintegrating tablet  Every 8 hours PRN        12/12/22 0241    famotidine (PEPCID) 20 MG  tablet  2 times daily        12/12/22 0241    naproxen (NAPROSYN) 500 MG tablet  2 times daily with meals        12/12/22 0241             Note:  This document was prepared using Dragon voice recognition software and may include unintentional dictation errors.   Carrie Mew, MD 12/12/22 [phone]

## 2023-02-14 ENCOUNTER — Ambulatory Visit: Payer: Medicaid Other | Admitting: Physician Assistant

## 2023-02-14 ENCOUNTER — Other Ambulatory Visit (HOSPITAL_COMMUNITY)
Admission: RE | Admit: 2023-02-14 | Discharge: 2023-02-14 | Disposition: A | Discharge status: Home / Self Care | Payer: Medicaid Other | Source: Ambulatory Visit | Attending: Physician Assistant | Admitting: Physician Assistant

## 2023-02-14 ENCOUNTER — Encounter: Payer: Self-pay | Admitting: Physician Assistant

## 2023-02-14 VITALS — BP 119/50 | HR 86 | Temp 98.0°F | Ht 63.0 in | Wt 179.2 lb

## 2023-02-14 DIAGNOSIS — N898 Other specified noninflammatory disorders of vagina: Secondary | ICD-10-CM | POA: Diagnosis not present

## 2023-02-14 DIAGNOSIS — Z202 Contact with and (suspected) exposure to infections with a predominantly sexual mode of transmission: Secondary | ICD-10-CM

## 2023-02-14 DIAGNOSIS — R399 Unspecified symptoms and signs involving the genitourinary system: Secondary | ICD-10-CM | POA: Diagnosis not present

## 2023-02-14 DIAGNOSIS — F419 Anxiety disorder, unspecified: Secondary | ICD-10-CM | POA: Diagnosis not present

## 2023-02-14 DIAGNOSIS — F32A Depression, unspecified: Secondary | ICD-10-CM

## 2023-02-14 LAB — POCT URINALYSIS DIPSTICK
Bilirubin, UA: POSITIVE
Glucose, UA: NEGATIVE
Ketones, UA: NEGATIVE
Nitrite, UA: NEGATIVE
Protein, UA: NEGATIVE
Spec Grav, UA: 1.01 (ref 1.010–1.025)
Urobilinogen, UA: NEGATIVE E.U./dL — AB
pH, UA: 6 (ref 5.0–8.0)

## 2023-02-14 NOTE — Progress Notes (Signed)
Established patient visit   Patient: Allison Flynn   DOB: 1981/05/02   42 y.o. Female  MRN: [id number] Visit Date: 02/14/2023  Today's healthcare provider: Mardene Speak, PA-C   CC: yeast infection  Subjective    HPI  Patient is a 42 year old female who presents for evaluation of possible yeast infection appeared after unprotected sex 1 week ago Needs a refill on Abuterol Sulfate and Citalopram Hydrobromide Reports having lower abdominal pain, yellowish vaginal discharge   [place] Visit from 02/14/2023 in [place]  PHQ-9 Total Score 9          02/14/2023    9:40 AM 03/28/2022    9:29 AM 02/22/2022    2:07 PM  GAD 7 : Generalized Anxiety Score  Nervous, Anxious, on Edge '1 1 3  '$ Control/stop worrying 1 0 3  Worry too much - different things '1 1 3  '$ Trouble relaxing 2 0 3  Restless 0 1 3  Easily annoyed or irritable 1 0 3  Afraid - awful might happen 0 0 2  Total GAD 7 Score '6 3 20  '$ Anxiety Difficulty  Not difficult at all Somewhat difficult   Denies  Medications: Outpatient Medications Prior to Visit  Medication Sig   albuterol (VENTOLIN HFA) 108 (90 Base) MCG/ACT inhaler Inhale 2 puffs into the lungs every 6 (six) hours as needed for shortness of breath.   citalopram (CELEXA) 10 MG tablet Take 1 tablet by mouth once daily   famotidine (PEPCID) 20 MG tablet Take 1 tablet (20 mg total) by mouth 2 (two) times daily.   naproxen (NAPROSYN) 500 MG tablet Take 1 tablet (500 mg total) by mouth 2 (two) times daily with a meal.   ondansetron (ZOFRAN-ODT) 4 MG disintegrating tablet Take 1 tablet (4 mg total) by mouth every 8 (eight) hours as needed for nausea or vomiting.   No facility-administered medications prior to visit.    Review of Systems  All other systems reviewed and are negative. Except see HPI      Objective    There were no vitals taken for this visit.   Physical Exam Vitals reviewed.  Constitutional:       General: She is not in acute distress.    Appearance: She is well-developed.  HENT:     Head: Normocephalic and atraumatic.  Eyes:     General: No scleral icterus.    Conjunctiva/sclera: Conjunctivae normal.  Cardiovascular:     Rate and Rhythm: Normal rate and regular rhythm.     Heart sounds: Normal heart sounds. No murmur heard. Pulmonary:     Effort: Pulmonary effort is normal. No respiratory distress.     Breath sounds: Normal breath sounds. No wheezing or rales.  Abdominal:     General: There is no distension.     Palpations: Abdomen is soft.     Tenderness: There is abdominal tenderness in the suprapubic area. There is no guarding or rebound.  Genitourinary:    Vagina: Vaginal discharge present.  Skin:    General: Skin is warm and dry.     Capillary Refill: Capillary refill takes less than 2 seconds.     Findings: No rash.  Neurological:     Mental Status: She is alert and oriented to person, place, and time.  Psychiatric:        Behavior: Behavior normal.      No results found for any visits on 02/14/23.  Assessment &  Plan     Possible exposure to STD After intercourse with a new partner x 1 week ago Requested to be tested for STD - HIV antibody (with reflex) - RPR - Cervicovaginal ancillary only Will reassess after  receiving lab results   Anxiety and depression Chronic, mild: [place] Office Visit from 02/14/2023 in [place]  PHQ-9 Total Score 9          02/14/2023    9:40 AM 03/28/2022    9:29 AM 02/22/2022    2:07 PM  GAD 7 : Generalized Anxiety Score  Nervous, Anxious, on Edge '1 1 3  '$ Control/stop worrying 1 0 3  Worry too much - different things '1 1 3  '$ Trouble relaxing 2 0 3  Restless 0 1 3  Easily annoyed or irritable 1 0 3  Afraid - awful might happen 0 0 2  Total GAD 7 Score '6 3 20  '$ Anxiety Difficulty  Not difficult at all Somewhat difficult   Doing well without medication Discussed with patient  her management options. Pt declined Rx of medication. However, if symptoms persist, I will call in her medication.   Urinary tract infection symptoms With increased urinary frequency, urgency, pelvic pain - POCT urinalysis dipstick Positive for leukocytes and blood Order micro only and urine culture Will reassess after  receiving lab results  Vaginal discharge X 1 week With pelvic pain, increased urinary frequency, urgency  - Cervicovaginal ancillary only Will reassess after  receiving lab results  No follow-ups on file.      The patient was advised to call back or seek an in-person evaluation if the symptoms worsen or if the condition fails to improve as anticipated.  I discussed the assessment and treatment plan with the patient. The patient was provided an opportunity to ask questions and all were answered. The patient agreed with the plan and demonstrated an understanding of the instructions.  I, Mardene Speak, PA-C have reviewed all documentation for this visit. The documentation on 02/14/23 for the exam, diagnosis, procedures, and orders are all accurate and complete.  Mardene Speak, [facility], [place] [phone] (phone) [phone] (fax)   [place]

## 2023-02-15 LAB — URINALYSIS, MICROSCOPIC ONLY
Bacteria, UA: NONE SEEN
Casts: NONE SEEN /lpf

## 2023-02-15 LAB — RPR: RPR Ser Ql: NONREACTIVE

## 2023-02-15 LAB — CERVICOVAGINAL ANCILLARY ONLY
Bacterial Vaginitis (gardnerella): POSITIVE — AB
Candida Glabrata: NEGATIVE
Candida Vaginitis: NEGATIVE
Chlamydia: NEGATIVE
Comment: NEGATIVE
Comment: NEGATIVE
Comment: NEGATIVE
Comment: NEGATIVE
Comment: NEGATIVE
Comment: NORMAL
Neisseria Gonorrhea: NEGATIVE
Trichomonas: POSITIVE — AB

## 2023-02-15 LAB — HIV ANTIBODY (ROUTINE TESTING W REFLEX): HIV Screen 4th Generation wRfx: NONREACTIVE

## 2023-02-15 NOTE — Progress Notes (Signed)
Please, let pt know that I will send her Metronidazole as she was positive for trichomonas and bacterial vaginitis. Urine culture - still pending.

## 2023-02-16 ENCOUNTER — Other Ambulatory Visit: Payer: Self-pay | Admitting: Physician Assistant

## 2023-02-16 DIAGNOSIS — N760 Acute vaginitis: Secondary | ICD-10-CM

## 2023-02-16 DIAGNOSIS — A599 Trichomoniasis, unspecified: Secondary | ICD-10-CM

## 2023-02-16 DIAGNOSIS — R399 Unspecified symptoms and signs involving the genitourinary system: Secondary | ICD-10-CM

## 2023-02-16 LAB — URINE CULTURE

## 2023-02-16 MED ORDER — METRONIDAZOLE 500 MG PO TABS: 500.0000 mg | ORAL_TABLET | Freq: Three times a day (TID) | ORAL | 0 refills | Status: DC

## 2023-02-16 MED ORDER — AMOXICILLIN-POT CLAVULANATE 875-125 MG PO TABS: 1.0000 | ORAL_TABLET | Freq: Two times a day (BID) | ORAL | 0 refills | Status: AC

## 2023-02-16 MED ORDER — FLUCONAZOLE 150 MG PO TABS: 150.0000 mg | ORAL_TABLET | Freq: Once | ORAL | 0 refills | Status: AC

## 2023-02-16 MED ORDER — METRONIDAZOLE 500 MG PO TABS: 500.0000 mg | ORAL_TABLET | Freq: Two times a day (BID) | ORAL | 0 refills | Status: AC

## 2023-02-16 NOTE — Progress Notes (Signed)
Please, let pt know that her urine culture result is back and showed beta hemolytic streptococcus. Penicillin will be sent to her pharmacy. She needs to take abx with meal and yogurt. I will send Diflucan to cover yeast infection

## 2023-03-01 ENCOUNTER — Telehealth: Payer: Self-pay | Admitting: *Deleted

## 2024-03-12 ENCOUNTER — Other Ambulatory Visit: Payer: Self-pay

## 2024-03-12 ENCOUNTER — Encounter: Payer: Self-pay | Admitting: Emergency Medicine

## 2024-03-12 ENCOUNTER — Emergency Department

## 2024-03-12 DIAGNOSIS — J45901 Unspecified asthma with (acute) exacerbation: Secondary | ICD-10-CM | POA: Insufficient documentation

## 2024-03-12 DIAGNOSIS — R059 Cough, unspecified: Secondary | ICD-10-CM | POA: Diagnosis present

## 2024-03-12 LAB — COMPREHENSIVE METABOLIC PANEL WITH GFR
ALT: 24 U/L (ref 0–44)
AST: 17 U/L (ref 15–41)
Albumin: 4.3 g/dL (ref 3.5–5.0)
Alkaline Phosphatase: 48 U/L (ref 38–126)
Anion gap: 8 (ref 5–15)
BUN: 14 mg/dL (ref 6–20)
CO2: 24 mmol/L (ref 22–32)
Calcium: 9.3 mg/dL (ref 8.9–10.3)
Chloride: 103 mmol/L (ref 98–111)
Creatinine, Ser: 0.67 mg/dL (ref 0.44–1.00)
GFR, Estimated: >60 mL/min (ref >60)
Glucose, Bld: 99 mg/dL (ref 70–99)
Potassium: 4.2 mmol/L (ref 3.5–5.1)
Sodium: 135 mmol/L (ref 135–145)
Total Bilirubin: 0.7 mg/dL (ref 0.0–1.2)
Total Protein: 7.2 g/dL (ref 6.5–8.1)

## 2024-03-12 LAB — TROPONIN I (HIGH SENSITIVITY): Troponin I (High Sensitivity): 3 ng/L (ref <18)

## 2024-03-12 LAB — CBC
HCT: 43.6 % (ref 36.0–46.0)
Hemoglobin: 15.4 g/dL — ABNORMAL HIGH (ref 12.0–15.0)
MCH: 32.1 pg (ref 26.0–34.0)
MCHC: 35.3 g/dL (ref 30.0–36.0)
MCV: 90.8 fL (ref 80.0–100.0)
Platelets: 241 10*3/uL (ref 150–400)
RBC: 4.8 MIL/uL (ref 3.87–5.11)
RDW: 12.2 % (ref 11.5–15.5)
WBC: 10.7 10*3/uL — ABNORMAL HIGH (ref 4.0–10.5)
nRBC: 0 % (ref 0.0–0.2)

## 2024-03-12 LAB — RESP PANEL BY RT-PCR (RSV, FLU A&B, COVID)  RVPGX2
Influenza A by PCR: NEGATIVE
Influenza B by PCR: NEGATIVE
Resp Syncytial Virus by PCR: NEGATIVE
SARS Coronavirus 2 by RT PCR: NEGATIVE

## 2024-03-12 NOTE — ED Triage Notes (Signed)
 Patient ambulatory to triage with steady gait, without difficulty or distress noted; pt reports since yesterday having mid upper CP radiating into rt side upper back accomp by [facility] and prod cough white sputum

## 2024-03-13 ENCOUNTER — Emergency Department
Admission: EM | Admit: 2024-03-13 | Discharge: 2024-03-13 | Disposition: A | Discharge status: Home / Self Care | Attending: Emergency Medicine | Admitting: Emergency Medicine

## 2024-03-13 DIAGNOSIS — R079 Chest pain, unspecified: Secondary | ICD-10-CM

## 2024-03-13 DIAGNOSIS — J45901 Unspecified asthma with (acute) exacerbation: Secondary | ICD-10-CM

## 2024-03-13 LAB — TROPONIN I (HIGH SENSITIVITY): Troponin I (High Sensitivity): 3 ng/L (ref <18)

## 2024-03-13 MED ORDER — ALBUTEROL SULFATE HFA 108 (90 BASE) MCG/ACT IN AERS: 2.0000 | INHALATION_SPRAY | Freq: Four times a day (QID) | RESPIRATORY_TRACT | 0 refills | Status: AC | PRN

## 2024-03-13 MED ORDER — IPRATROPIUM-ALBUTEROL 0.5-2.5 (3) MG/3ML IN SOLN
3.0000 mL | Freq: Once | RESPIRATORY_TRACT | Status: AC
  Administered 2024-03-13: 3 mL via RESPIRATORY_TRACT
  Filled 2024-03-13: qty 3

## 2024-03-13 MED ORDER — PREDNISONE 20 MG PO TABS: 40.0000 mg | ORAL_TABLET | Freq: Every day | ORAL | 0 refills | Status: AC

## 2024-03-13 NOTE — Discharge Instructions (Signed)
 You are seen in the ER for your chest pain and shortness of breath.  I suspect you likely have a viral illness that is flared your asthma.  You can use your inhaler as needed.  Take your steroids as directed.  Follow-up with your primary care doctor for further evaluation.  Return to the ER for new or worsening symptoms.

## 2024-03-13 NOTE — ED Provider Notes (Signed)
 [facility] Provider Note    Event Date/Time   First MD Initiated Contact with Patient 03/13/24 0125     (approximate)   History   Chest Pain   HPI  YENI JIGGETTS is a 43 year old female with history of asthma presenting to the emergency department for evaluation of cough and chest pain.  Yesterday, had onset of cough.  This morning noticed some pain in her chest worse with coughing.  Additionally reports some shortness of breath and productive cough.  No fevers.  Does report known sick contacts.  Typically has rare use of her rescue inhaler, but does note increased use when she is sick.  Ran out of her own inhaler recently.      Physical Exam   Triage Vital Signs: ED Triage Vitals  Encounter Vitals Group     BP 03/12/24 2229 (!) 144/84     Systolic BP Percentile --      Diastolic BP Percentile --      Pulse Rate 03/12/24 2229 97     Resp 03/12/24 2229 18     Temp 03/12/24 2229 97.9 F (36.6 C)     Temp Source 03/12/24 2229 Oral     SpO2 03/12/24 2229 98 %     Weight 03/12/24 2214 155 lb (70.3 kg)     Height 03/12/24 2214 5\' 4"  (1.626 m)     Head Circumference --      Peak Flow --      Pain Score 03/12/24 2214 8     Pain Loc --      Pain Education --      Exclude from Growth Chart --     Most recent vital signs: Vitals:   03/12/24 2229 03/13/24 0100  BP: (!) 144/84 (!) 144/94  Pulse: 97 95  Resp: 18 17  Temp: 97.9 F (36.6 C) 97.7 F (36.5 C)  SpO2: 98% 97%     General: Awake, interactive  CV:  Regular rate, good peripheral perfusion.  Resp:  Unlabored respirations, scattered expiratory wheezing Abd:  Nondistended.  Neuro:  Symmetric facial movement, fluid speech   ED Results / Procedures / Treatments   Labs (all labs ordered are listed, but only abnormal results are displayed) Labs Reviewed  CBC - Abnormal; Notable for the following components:      Result Value   WBC 10.7 (*)    Hemoglobin 15.4 (*)    All other  components within normal limits  RESP PANEL BY RT-PCR (RSV, FLU A&B, COVID)  RVPGX2  COMPREHENSIVE METABOLIC PANEL WITH GFR  TROPONIN I (HIGH SENSITIVITY)  TROPONIN I (HIGH SENSITIVITY)     EKG EKG independently reviewed interpreted by myself (ER attending) demonstrates:  EKG demonstrates normal sinus rhythm rate of 91, PR 144, QRS 82, QTc 440, no acute ST changes  RADIOLOGY Imaging independently reviewed and interpreted by myself demonstrates:  CXR without focal consolidation  PROCEDURES:  Critical Care performed: No  Procedures   MEDICATIONS ORDERED IN ED: Medications  ipratropium-albuterol (DUONEB) 0.5-2.5 (3) MG/3ML nebulizer solution 3 mL (3 mLs Nebulization Given 03/13/24 0142)     IMPRESSION / MDM / ASSESSMENT AND PLAN / ED COURSE  I reviewed the triage vital signs and the nursing notes.  Differential diagnosis includes, but is not limited to, viral illness, pneumonia, asthma flare, much lower suspicion ACS, low risk PE and PERC negative  Patient's presentation is most consistent with acute presentation with potential threat to life or bodily function.  43 year old presenting with chest pain and shortness of breath.  Stable vitals on presentation.  Clinical history seems most consistent with viral illness triggering an asthma flare.  Labs overall reassuring.  X-Jasreet Dickie without pneumonia.  Given a DuoNeb treatment and reassessment significant improvement.  Do think she is stable for discharge home.  Will refill her albuterol inhaler and put her on a short steroid course.  Strict return precautions provided.  Patient discharged stable condition.      FINAL CLINICAL IMPRESSION(S) / ED DIAGNOSES   Final diagnoses:  Exacerbation of asthma, unspecified asthma severity, unspecified whether persistent  Nonspecific chest pain     Rx / DC Orders   ED Discharge Orders          Ordered    albuterol (VENTOLIN HFA) 108 (90 Base) MCG/ACT inhaler  Every 6 hours PRN         03/13/24 0209    predniSONE (DELTASONE) 20 MG tablet  Daily with breakfast        03/13/24 0209             Note:  This document was prepared using Dragon voice recognition software and may include unintentional dictation errors.   Trinna Post, MD 03/13/24 [phone]
